# Patient Record
Sex: Female | Born: 1935 | Race: White | Hispanic: No | Smoking: Never smoker
Health system: Southern US, Community
[De-identification: ages and names within clinical notes are randomized; demographics above are authoritative.]

## PROBLEM LIST (undated history)

## (undated) DIAGNOSIS — F0390 Unspecified dementia without behavioral disturbance: Secondary | ICD-10-CM

## (undated) HISTORY — PX: REPLACEMENT TOTAL HIP W/  RESURFACING IMPLANTS: SUR1222

---

## 2004-06-04 ENCOUNTER — Ambulatory Visit: Payer: Self-pay | Admitting: Internal Medicine

## 2005-05-14 ENCOUNTER — Ambulatory Visit: Payer: Self-pay

## 2005-06-22 ENCOUNTER — Ambulatory Visit: Payer: Self-pay | Admitting: Internal Medicine

## 2006-06-30 ENCOUNTER — Ambulatory Visit: Payer: Self-pay | Admitting: Internal Medicine

## 2007-01-24 ENCOUNTER — Ambulatory Visit: Payer: Self-pay | Admitting: Internal Medicine

## 2007-04-05 ENCOUNTER — Ambulatory Visit: Payer: Self-pay | Admitting: Internal Medicine

## 2007-07-03 ENCOUNTER — Ambulatory Visit: Payer: Self-pay | Admitting: Internal Medicine

## 2008-02-22 ENCOUNTER — Ambulatory Visit: Payer: Self-pay | Admitting: Internal Medicine

## 2008-07-04 ENCOUNTER — Ambulatory Visit: Payer: Self-pay | Admitting: Internal Medicine

## 2008-08-26 ENCOUNTER — Ambulatory Visit: Payer: Self-pay | Admitting: Internal Medicine

## 2009-07-07 ENCOUNTER — Ambulatory Visit: Payer: Self-pay | Admitting: Internal Medicine

## 2009-09-16 ENCOUNTER — Ambulatory Visit: Payer: Self-pay | Admitting: Internal Medicine

## 2009-12-11 ENCOUNTER — Encounter: Payer: Self-pay | Admitting: Rheumatology

## 2010-07-08 ENCOUNTER — Ambulatory Visit: Payer: Self-pay | Admitting: Internal Medicine

## 2011-07-23 ENCOUNTER — Ambulatory Visit: Payer: Self-pay | Admitting: Internal Medicine

## 2012-03-15 ENCOUNTER — Ambulatory Visit: Payer: Self-pay | Admitting: Internal Medicine

## 2012-07-25 ENCOUNTER — Ambulatory Visit: Payer: Self-pay | Admitting: Internal Medicine

## 2013-02-12 ENCOUNTER — Ambulatory Visit: Payer: Self-pay | Admitting: Internal Medicine

## 2013-08-24 ENCOUNTER — Ambulatory Visit: Payer: Self-pay | Admitting: Internal Medicine

## 2014-01-07 ENCOUNTER — Ambulatory Visit: Payer: Self-pay | Admitting: Internal Medicine

## 2014-06-27 ENCOUNTER — Ambulatory Visit: Payer: Self-pay | Admitting: Internal Medicine

## 2014-09-19 ENCOUNTER — Ambulatory Visit
Admit: 2014-09-19 | Disposition: A | Discharge status: Home / Self Care | Payer: Self-pay | Attending: Internal Medicine | Admitting: Internal Medicine

## 2016-03-09 ENCOUNTER — Other Ambulatory Visit: Payer: Self-pay | Admitting: Internal Medicine

## 2016-03-09 DIAGNOSIS — M79604 Pain in right leg: Secondary | ICD-10-CM

## 2016-03-09 DIAGNOSIS — R600 Localized edema: Secondary | ICD-10-CM

## 2016-03-25 ENCOUNTER — Other Ambulatory Visit: Payer: Self-pay | Admitting: Internal Medicine

## 2016-03-25 DIAGNOSIS — M79604 Pain in right leg: Secondary | ICD-10-CM

## 2016-03-25 DIAGNOSIS — R600 Localized edema: Secondary | ICD-10-CM

## 2016-04-01 ENCOUNTER — Ambulatory Visit
Admission: RE | Admit: 2016-04-01 | Discharge: 2016-04-01 | Disposition: A | Discharge status: Home / Self Care | Payer: Medicare Other | Source: Ambulatory Visit | Attending: Internal Medicine | Admitting: Internal Medicine

## 2016-04-01 DIAGNOSIS — M79604 Pain in right leg: Secondary | ICD-10-CM | POA: Diagnosis present

## 2016-04-01 DIAGNOSIS — R600 Localized edema: Secondary | ICD-10-CM | POA: Diagnosis present

## 2018-01-17 ENCOUNTER — Emergency Department: Payer: Medicare Other

## 2018-01-17 ENCOUNTER — Emergency Department
Admission: EM | Admit: 2018-01-17 | Discharge: 2018-01-17 | Disposition: A | Discharge status: Home / Self Care | Payer: Medicare Other | Attending: Emergency Medicine | Admitting: Emergency Medicine

## 2018-01-17 DIAGNOSIS — F0390 Unspecified dementia without behavioral disturbance: Secondary | ICD-10-CM | POA: Diagnosis not present

## 2018-01-17 DIAGNOSIS — Z96649 Presence of unspecified artificial hip joint: Secondary | ICD-10-CM | POA: Diagnosis not present

## 2018-01-17 DIAGNOSIS — Y998 Other external cause status: Secondary | ICD-10-CM | POA: Insufficient documentation

## 2018-01-17 DIAGNOSIS — S62101A Fracture of unspecified carpal bone, right wrist, initial encounter for closed fracture: Secondary | ICD-10-CM | POA: Diagnosis not present

## 2018-01-17 DIAGNOSIS — Y939 Activity, unspecified: Secondary | ICD-10-CM | POA: Diagnosis not present

## 2018-01-17 DIAGNOSIS — W19XXXA Unspecified fall, initial encounter: Secondary | ICD-10-CM | POA: Insufficient documentation

## 2018-01-17 DIAGNOSIS — Y929 Unspecified place or not applicable: Secondary | ICD-10-CM | POA: Diagnosis not present

## 2018-01-17 DIAGNOSIS — S6991XA Unspecified injury of right wrist, hand and finger(s), initial encounter: Secondary | ICD-10-CM | POA: Diagnosis present

## 2018-01-17 HISTORY — DX: Unspecified dementia without behavioral disturbance: F03.90

## 2018-01-17 LAB — BASIC METABOLIC PANEL
Anion gap: 4 — ABNORMAL LOW (ref 5–15)
BUN: 22 mg/dL (ref 8–23)
CALCIUM: 9.7 mg/dL (ref 8.9–10.3)
CHLORIDE: 113 mmol/L — AB (ref 98–111)
CO2: 27 mmol/L (ref 22–32)
CREATININE: 0.87 mg/dL (ref 0.44–1.00)
GFR calc non Af Amer: 59 mL/min — ABNORMAL LOW (ref >60)
Glucose, Bld: 206 mg/dL — ABNORMAL HIGH (ref 70–99)
Potassium: 5.1 mmol/L (ref 3.5–5.1)
SODIUM: 144 mmol/L (ref 135–145)

## 2018-01-17 LAB — URINALYSIS, COMPLETE (UACMP) WITH MICROSCOPIC
Bacteria, UA: NONE SEEN
Bilirubin Urine: NEGATIVE
Glucose, UA: >=500 mg/dL — AB
Hgb urine dipstick: NEGATIVE
KETONES UR: NEGATIVE mg/dL
Nitrite: NEGATIVE
PH: 6 (ref 5.0–8.0)
PROTEIN: NEGATIVE mg/dL
Specific Gravity, Urine: 1.022 (ref 1.005–1.030)

## 2018-01-17 LAB — CBC WITH DIFFERENTIAL/PLATELET
Basophils Absolute: 0 10*3/uL (ref 0–0.1)
Basophils Relative: 0 %
EOS ABS: 0 10*3/uL (ref 0–0.7)
EOS PCT: 0 %
HCT: 40 % (ref 35.0–47.0)
Hemoglobin: 13.4 g/dL (ref 12.0–16.0)
Lymphocytes Relative: 11 %
Lymphs Abs: 1.3 10*3/uL (ref 1.0–3.6)
MCH: 29.9 pg (ref 26.0–34.0)
MCHC: 33.4 g/dL (ref 32.0–36.0)
MCV: 89.5 fL (ref 80.0–100.0)
MONO ABS: 1.1 10*3/uL — AB (ref 0.2–0.9)
MONOS PCT: 10 %
Neutro Abs: 9.1 10*3/uL — ABNORMAL HIGH (ref 1.4–6.5)
Neutrophils Relative %: 79 %
PLATELETS: 193 10*3/uL (ref 150–440)
RBC: 4.47 MIL/uL (ref 3.80–5.20)
RDW: 13.9 % (ref 11.5–14.5)
WBC: 11.5 10*3/uL — ABNORMAL HIGH (ref 3.6–11.0)

## 2018-01-17 LAB — TROPONIN I

## 2018-01-17 MED ORDER — LORAZEPAM 2 MG/ML IJ SOLN
1.0000 mg | Freq: Once | INTRAMUSCULAR | Status: AC
  Administered 2018-01-17: 1 mg via INTRAMUSCULAR
  Filled 2018-01-17: qty 1

## 2018-01-17 NOTE — ED Provider Notes (Signed)
[facility] Emergency Department Provider Note  ____________________________________________   I have reviewed the triage vital signs and the nursing notes.   HISTORY  Chief Complaint Fall   History limited by and level 5 caveat due to: Dementia   HPI Dorothy Murphy is a 84 y.o. female who presents to the emergency department today via EMS because of concerns for right wrist pain after a fall.  Patient has advanced dementia and cannot give any history.  Per EMS report the patient had a fall at 3 AM today.  Family had been trying to convince her to come to the emergency department throughout the day.  Finally called EMS.  Upon arrival they noted some slight deformity to the right wrist.    Per medical record review patient has a history of dementia  Past Medical History:  Diagnosis Date  . Advanced dementia     There are no active problems to display for this patient.   Past Surgical History:  Procedure Laterality Date  . REPLACEMENT TOTAL HIP W/  RESURFACING IMPLANTS     family unaable to recall which hip     Prior to Admission medications   Not on File    Allergies Patient has no allergy information on record.  History reviewed. No pertinent family history.  Social History Social History   Tobacco Use  . Smoking status: Not on file  Substance Use Topics  . Alcohol use: Never    Frequency: Never  . Drug use: Never    Review of Systems Unable to obtain secondary to dementia.   ____________________________________________   PHYSICAL EXAM:  VITAL SIGNS: ED Triage Vitals  Enc Vitals Group     BP 01/17/18 1716 (!) 133/58     Pulse Rate 01/17/18 1715 81     Resp 01/17/18 1715 18     Temp --      Temp src --      SpO2 01/17/18 1715 99 %     Weight 01/17/18 1716 150 lb (68 kg)     Height 01/17/18 1716 5\' 4"  (1.626 m)   Constitutional: Awake and alert. Not oriented. Eyes: Conjunctivae are normal.  ENT      Head: Normocephalic  and atraumatic.      Nose: No congestion/rhinnorhea.      Mouth/Throat: Mucous membranes are moist.      Neck: No stridor. Hematological/Lymphatic/Immunilogical: No cervical lymphadenopathy. Cardiovascular: Normal rate, regular rhythm.  No murmurs, rubs, or gallops.  Respiratory: Normal respiratory effort without tachypnea nor retractions. Breath sounds are clear and equal bilaterally. No wheezes/rales/rhonchi. Gastrointestinal: Soft and non tender. No rebound. No guarding.  Genitourinary: Deferred Musculoskeletal: Slight swelling and deformity noted to right wrist. Tender to palpation.  Neurologic:  Not oriented to place, time or events. Moving all extremities. Skin:  Skin is warm, dry and intact. No rash noted. Psychiatric: Demented  ____________________________________________    LABS (pertinent positives/negatives)  Trop <0.03 CBC wbc 11.5, hgb 13.4, plt 193 BMP na 144, k 5.1, glu 206, cr 0.87 UA >500 glucose, moderate leukocytes, 11-20 wbc 6-10 squamous  ____________________________________________   EKG  I, Phineas SemenGraydon Srihaan Mastrangelo, attending physician, personally viewed and interpreted this EKG  EKG Time: 1853 Rate: 76 Rhythm: normal sinus rhythm Axis: left axis deviation Intervals: qtc 422 QRS: narrow ST changes: no st elevation Impression: abnormal ekg  ____________________________________________    RADIOLOGY  Right wrist Distal radial fracture  I, Phineas SemenGraydon Aastha Dayley, personally viewed and evaluated these images (plain radiographs) as part  of my medical decision making.  CT head/cervical spine No acute process ____________________________________________   PROCEDURES  Procedures  POST SPLINT CHECK Right volar splint applied by tech.  Good position.  Distally N/V intact, sensation intact. No discoloration.  ____________________________________________   INITIAL IMPRESSION / ASSESSMENT AND PLAN / ED COURSE  Pertinent labs & imaging results that were  available during my care of the patient were reviewed by me and considered in my medical decision making (see chart for details).   Presented to the emergency department today after a fall that occurred last night.  Patient with some swelling and deformity to the right wrist.  X-rays do show a distal right radial fracture.  This will be splinted.  Discussed this with family importance of orthopedic follow-up.  In addition blood work and urine was checked.  Urine did have some white blood cells but also had squamous cells.  At this point will send for urine culture.  Discussed this finding with the patient's son.  ____________________________________________   FINAL CLINICAL IMPRESSION(S) / ED DIAGNOSES  Final diagnoses:  Fall, initial encounter  Closed fracture of right wrist, initial encounter     Note: This dictation was prepared with Nurse, children'sDragon dictation. Any transcriptional errors that result from this process are unintentional      Phineas SemenGoodman, Newell Wafer, MD 01/17/18 1934

## 2018-01-17 NOTE — ED Triage Notes (Signed)
Pt arrives via ems from home with reports of unwitnessed fall at 3am this morning. Pt family states where pt fell pt would not have hit head on anything. Pt had advanced dementia, alert, but not oriented. Ems states right wrist swelling and  deformity from fall. No acute distress at this time

## 2018-01-17 NOTE — Discharge Instructions (Signed)
Please seek medical attention for any high fevers, chest pain, shortness of breath, change in behavior, persistent vomiting, bloody stool or any other new or concerning symptoms.  

## 2018-01-17 NOTE — ED Notes (Signed)
On arrival wrist splinted by ems. Md removed to assess pt. Pt scratching at wrist and moving arm around freely but moans when she moves wrist. RN attempted redirect pt and have her decrease movement, also attempted to re splint wrist but pt removing wrap at this time and will not leave in place.

## 2018-01-19 LAB — URINE CULTURE: Culture: NO GROWTH

## 2018-01-22 ENCOUNTER — Other Ambulatory Visit: Payer: Self-pay

## 2018-01-22 ENCOUNTER — Encounter: Payer: Self-pay | Admitting: Emergency Medicine

## 2018-01-22 ENCOUNTER — Inpatient Hospital Stay
Admission: EM | Admit: 2018-01-22 | Discharge: 2018-01-24 | DRG: 536 | Disposition: A | Discharge status: Skilled Nursing Facility | Payer: Medicare Other | Attending: Internal Medicine | Admitting: Internal Medicine

## 2018-01-22 ENCOUNTER — Emergency Department: Payer: Medicare Other

## 2018-01-22 DIAGNOSIS — S32599A Other specified fracture of unspecified pubis, initial encounter for closed fracture: Secondary | ICD-10-CM | POA: Diagnosis present

## 2018-01-22 DIAGNOSIS — F0390 Unspecified dementia without behavioral disturbance: Secondary | ICD-10-CM

## 2018-01-22 DIAGNOSIS — Z79899 Other long term (current) drug therapy: Secondary | ICD-10-CM

## 2018-01-22 DIAGNOSIS — R64 Cachexia: Secondary | ICD-10-CM | POA: Diagnosis not present

## 2018-01-22 DIAGNOSIS — R7303 Prediabetes: Secondary | ICD-10-CM | POA: Diagnosis not present

## 2018-01-22 DIAGNOSIS — S5011XD Contusion of right forearm, subsequent encounter: Secondary | ICD-10-CM | POA: Diagnosis not present

## 2018-01-22 DIAGNOSIS — S32591A Other specified fracture of right pubis, initial encounter for closed fracture: Principal | ICD-10-CM | POA: Diagnosis present

## 2018-01-22 DIAGNOSIS — W19XXXA Unspecified fall, initial encounter: Secondary | ICD-10-CM | POA: Diagnosis present

## 2018-01-22 DIAGNOSIS — Z96649 Presence of unspecified artificial hip joint: Secondary | ICD-10-CM | POA: Diagnosis present

## 2018-01-22 DIAGNOSIS — E860 Dehydration: Secondary | ICD-10-CM | POA: Diagnosis not present

## 2018-01-22 DIAGNOSIS — R627 Adult failure to thrive: Secondary | ICD-10-CM | POA: Diagnosis not present

## 2018-01-22 DIAGNOSIS — Z6825 Body mass index (BMI) 25.0-25.9, adult: Secondary | ICD-10-CM | POA: Diagnosis not present

## 2018-01-22 DIAGNOSIS — S52501D Unspecified fracture of the lower end of right radius, subsequent encounter for closed fracture with routine healing: Secondary | ICD-10-CM

## 2018-01-22 DIAGNOSIS — W19XXXD Unspecified fall, subsequent encounter: Secondary | ICD-10-CM | POA: Diagnosis not present

## 2018-01-22 DIAGNOSIS — E870 Hyperosmolality and hypernatremia: Secondary | ICD-10-CM | POA: Diagnosis present

## 2018-01-22 DIAGNOSIS — Z66 Do not resuscitate: Secondary | ICD-10-CM | POA: Diagnosis not present

## 2018-01-22 DIAGNOSIS — M25551 Pain in right hip: Secondary | ICD-10-CM | POA: Diagnosis present

## 2018-01-22 DIAGNOSIS — N3000 Acute cystitis without hematuria: Secondary | ICD-10-CM

## 2018-01-22 DIAGNOSIS — Z7189 Other specified counseling: Secondary | ICD-10-CM

## 2018-01-22 DIAGNOSIS — Z515 Encounter for palliative care: Secondary | ICD-10-CM

## 2018-01-22 LAB — COMPREHENSIVE METABOLIC PANEL
ALT: 23 U/L (ref 0–44)
AST: 31 U/L (ref 15–41)
Albumin: 3.1 g/dL — ABNORMAL LOW (ref 3.5–5.0)
Alkaline Phosphatase: 59 U/L (ref 38–126)
Anion gap: 8 (ref 5–15)
BILIRUBIN TOTAL: 1.5 mg/dL — AB (ref 0.3–1.2)
BUN: 36 mg/dL — ABNORMAL HIGH (ref 8–23)
CO2: 24 mmol/L (ref 22–32)
CREATININE: 0.85 mg/dL (ref 0.44–1.00)
Calcium: 8.8 mg/dL — ABNORMAL LOW (ref 8.9–10.3)
Chloride: 115 mmol/L — ABNORMAL HIGH (ref 98–111)
Glucose, Bld: 126 mg/dL — ABNORMAL HIGH (ref 70–99)
Potassium: 3.4 mmol/L — ABNORMAL LOW (ref 3.5–5.1)
Sodium: 147 mmol/L — ABNORMAL HIGH (ref 135–145)
TOTAL PROTEIN: 5.9 g/dL — AB (ref 6.5–8.1)

## 2018-01-22 LAB — CBC
HEMATOCRIT: 37.7 % (ref 35.0–47.0)
Hemoglobin: 12.8 g/dL (ref 12.0–16.0)
MCH: 30.1 pg (ref 26.0–34.0)
MCHC: 34 g/dL (ref 32.0–36.0)
MCV: 88.7 fL (ref 80.0–100.0)
Platelets: 181 10*3/uL (ref 150–440)
RBC: 4.25 MIL/uL (ref 3.80–5.20)
RDW: 13.5 % (ref 11.5–14.5)
WBC: 10 10*3/uL (ref 3.6–11.0)

## 2018-01-22 LAB — URINALYSIS, COMPLETE (UACMP) WITH MICROSCOPIC
Bacteria, UA: NONE SEEN
Bilirubin Urine: NEGATIVE
GLUCOSE, UA: NEGATIVE mg/dL
HGB URINE DIPSTICK: NEGATIVE
Ketones, ur: 5 mg/dL — AB
Leukocytes, UA: NEGATIVE
NITRITE: NEGATIVE
PH: 5 (ref 5.0–8.0)
Protein, ur: NEGATIVE mg/dL
SPECIFIC GRAVITY, URINE: 1.029 (ref 1.005–1.030)

## 2018-01-22 MED ORDER — ONDANSETRON HCL 4 MG PO TABS
4.0000 mg | ORAL_TABLET | Freq: Four times a day (QID) | ORAL | Status: DC | PRN
Start: 2018-01-22 — End: 2018-01-24

## 2018-01-22 MED ORDER — ENOXAPARIN SODIUM 40 MG/0.4ML ~~LOC~~ SOLN
40.0000 mg | SUBCUTANEOUS | Status: DC
  Administered 2018-01-22 – 2018-01-23 (×2): 40 mg via SUBCUTANEOUS
  Filled 2018-01-22 (×2): qty 0.4

## 2018-01-22 MED ORDER — SODIUM CHLORIDE 0.9 % IV BOLUS
500.0000 mL | Freq: Once | INTRAVENOUS | Status: AC
  Administered 2018-01-22: 500 mL via INTRAVENOUS

## 2018-01-22 MED ORDER — TRAMADOL HCL 50 MG PO TABS
50.0000 mg | ORAL_TABLET | Freq: Four times a day (QID) | ORAL | Status: DC | PRN
  Administered 2018-01-23 (×2): 50 mg via ORAL
  Filled 2018-01-22 (×2): qty 1

## 2018-01-22 MED ORDER — ACETAMINOPHEN 325 MG PO TABS
650.0000 mg | ORAL_TABLET | Freq: Four times a day (QID) | ORAL | Status: DC | PRN
  Administered 2018-01-24: 650 mg via ORAL
  Filled 2018-01-22: qty 2

## 2018-01-22 MED ORDER — SODIUM CHLORIDE 0.9 % IV SOLN
1.0000 g | INTRAVENOUS | Status: DC
  Administered 2018-01-22: 1 g via INTRAVENOUS
  Filled 2018-01-22: qty 1
  Filled 2018-01-22: qty 10

## 2018-01-22 MED ORDER — POTASSIUM CHLORIDE CRYS ER 10 MEQ PO TBCR
10.0000 meq | EXTENDED_RELEASE_TABLET | Freq: Every day | ORAL | Status: DC
  Administered 2018-01-22: 10 meq via ORAL
  Filled 2018-01-22: qty 1

## 2018-01-22 MED ORDER — ONDANSETRON HCL 4 MG/2ML IJ SOLN: 4.0000 mg | Freq: Four times a day (QID) | INTRAMUSCULAR | Status: DC | PRN

## 2018-01-22 MED ORDER — LORAZEPAM 0.5 MG PO TABS
0.5000 mg | ORAL_TABLET | Freq: Two times a day (BID) | ORAL | Status: DC | PRN
Start: 2018-01-22 — End: 2018-01-24

## 2018-01-22 MED ORDER — DOCUSATE SODIUM 100 MG PO CAPS: 100.0000 mg | ORAL_CAPSULE | Freq: Two times a day (BID) | ORAL | Status: DC

## 2018-01-22 MED ORDER — ENOXAPARIN SODIUM 30 MG/0.3ML ~~LOC~~ SOLN: 30.0000 mg | SUBCUTANEOUS | Status: DC

## 2018-01-22 MED ORDER — ACETAMINOPHEN 650 MG RE SUPP: 650.0000 mg | Freq: Four times a day (QID) | RECTAL | Status: DC | PRN

## 2018-01-22 NOTE — Progress Notes (Signed)
Family Meeting Note  Advance Directive:yes  Today a meeting took place with the Patient, patient's son at bedside  Patient is unable to participate due ZO:XWRUEAto:Lacked capacity Dementia   The following clinical team members were present during this meeting:MD  The following were discussed:Patient's diagnosis: Acute cystitis, failure to thrive, dehydration, pubic ramus fracture, fall, borderline diabetes mellitus, treatment plan of care discussed in detail with the patient son and his girlfriend at bedside   patient's progosis: Unable to determine and Goals for treatment: DNR, son Gerlene BurdockRichard is the medical healthcare power of attorney  Additional follow-up to be provided: Hospitalist, palliative care  Time spent during discussion:19 min  Dorothy LabAruna Avaiyah Strubel, MD

## 2018-01-22 NOTE — ED Notes (Addendum)
Patient transported to X-ray 

## 2018-01-22 NOTE — ED Notes (Signed)
Pt back in Room 1 from Xray, back on monitor

## 2018-01-22 NOTE — Plan of Care (Signed)

## 2018-01-22 NOTE — H&P (Signed)
[facility] [place] [facility]   PATIENT NAME: Dorothy Murphy Adler    MR#:  [id number]  DATE OF BIRTH:  01/03/1929  DATE OF ADMISSION:  01/22/2018  PRIMARY CARE PHYSICIAN: Marguarite ArbourSparks, Jeffrey D, MD   REQUESTING/REFERRING PHYSICIAN:Schaevitz, Myra Rudeavid Matthew, MD  CHIEF COMPLAINT:  Fall, dehydration failure to thrive  HISTORY OF PRESENT ILLNESS:  Dorothy Murphy Finlay  is a 84 y.o. female with a known history of dementia with recent history of right-sided wrist fracture is brought into the ED after she sustained a fall on Tuesday.  Since the fall patient was not walking, not eating or drinking which is concerning to the family members and patient is brought into the emergency department x-ray of the has revealed pubic ramus fracture on the right side.  Urine looks abnormal.  Hospitalist team is called to admit the patient  PAST MEDICAL HISTORY:   Past Medical History:  Diagnosis Date  . Advanced dementia     PAST SURGICAL HISTOIRY:   Past Surgical History:  Procedure Laterality Date  . REPLACEMENT TOTAL HIP W/  RESURFACING IMPLANTS     family unaable to recall which hip     SOCIAL HISTORY:   Social History   Tobacco Use  . Smoking status: Not on file  Substance Use Topics  . Alcohol use: Never    Frequency: Never    FAMILY HISTORY:  No family history on file.  DRUG ALLERGIES:  Not on File  REVIEW OF SYSTEMS:  Review of system unobtainable as the patient is demented chronically  MEDICATIONS AT HOME:   Prior to Admission medications   Medication Sig Start Date End Date Taking? Authorizing Provider  LORazepam (ATIVAN) 0.5 MG tablet Take 0.5 mg by mouth every 12 (twelve) hours as needed. for anxiety 11/15/17  Yes [provider]  potassium chloride (MICRO-K) 10 MEQ CR capsule Take 10 mEq by mouth 2 (two) times daily. 03/21/17 03/21/18 Yes [provider]      VITAL SIGNS:  Blood pressure 134/65, pulse 78, temperature 98.3 F (36.8 C),  temperature source Axillary, resp. rate 20, height 5\' 4"  (1.626 m), weight 68 kg, SpO2 100 %.  PHYSICAL EXAMINATION:  GENERAL:  84 y.o.-year-old patient lying in the bed with no acute distress.   emaciated EYES: Pupils equal, round, reactive to light and accommodation. No scleral icterus. Extraocular muscles intact.  HEENT: Head atraumatic, normocephalic. Oropharynx and nasopharynx clear.  NECK:  Supple, no jugular venous distention. No thyroid enlargement, no tenderness.  LUNGS: Normal breath sounds bilaterally, no wheezing, rales,rhonchi or crepitation. No use of accessory muscles of respiration.  CARDIOVASCULAR: S1, S2 normal. No murmurs, rubs, or gallops.  ABDOMEN: Soft, nontender, nondistended. Bowel sounds present. EXTREMITIES: Right pubic ramus is tender no pedal edema, cyanosis, or clubbing.  NEUROLOGIC: Awake, alert and disoriented  pSYCHIATRIC: The patient is alert and disoriented SKIN: No obvious rash, lesion, or ulcer.   LABORATORY PANEL:   CBC Recent Labs  Lab 01/22/18 1435  WBC 10.0  HGB 12.8  HCT 37.7  PLT 181   ------------------------------------------------------------------------------------------------------------------  Chemistries  Recent Labs  Lab 01/22/18 1435  NA 147*  K 3.4*  CL 115*  CO2 24  GLUCOSE 126*  BUN 36*  CREATININE 0.85  CALCIUM 8.8*  AST 31  ALT 23  ALKPHOS 59  BILITOT 1.5*   ------------------------------------------------------------------------------------------------------------------  Cardiac Enzymes Recent Labs  Lab 01/17/18 1814  TROPONINI <0.03   ------------------------------------------------------------------------------------------------------------------  RADIOLOGY:  Dg Hip Unilat W Or Wo Pelvis 2-3  Views Right  Result Date: 01/22/2018 CLINICAL DATA:  Persists right hip pain.  After fall 1 week ago EXAM: DG HIP (WITH OR WITHOUT PELVIS) 2-3V RIGHT COMPARISON:  CT abdomen pelvis dated March 15, 2012. FINDINGS:  Acute minimally displaced fractures of the right superior and inferior pubic rami. No femur fracture. The pubic symphysis and sacroiliac joints are intact. Prior left total hip arthroplasty. Severe osteopenia. Soft tissues are unremarkable. IMPRESSION: Acute minimally displaced fractures of the right superior and inferior pubic rami. Electronically Signed   By: Obie DredgeWilliam T Derry M.D.   On: 01/22/2018 15:51    EKG:   Orders placed or performed during the hospital encounter of 01/22/18  . ED EKG  . ED EKG  . EKG 12-Lead  . EKG 12-Lead    IMPRESSION AND PLAN:   Dorothy Murphy  is a 84 y.o. female with a known history of dementia with recent history of right-sided wrist fracture is brought into the ED after she sustained a fall on Tuesday.  Since the fall patient was not walking, not eating or drinking which is concerning to the family members and patient is brought into the emergency department x-ray of the has revealed pubic ramus fracture on the right side.  #Acute cystitis Admit to MedSurg floor We will get urine culture and sensitivity and patient is started on empiric IV antibiotic Rocephin Gentle hydration with IV fluids  #Failure to thrive Patient stopped eating or drinking from last Tuesday Has baseline dementia and progressively getting worse Hydrate with IV fluids Check TSH Palliative care consult placed  #Fall with right pubic ramus fracture Nonoperable Pain management as needed PT consult  #Dehydration from poor p.o. Intake Hydrate with IV fluids and monitor patient closely  # borderline diabetes mellitus Check hemoglobin A1c and sliding scale insulin   All the records are reviewed and case discussed with ED provider. Management plans discussed with the patient, family and they are in agreement.  CODE STATUS: DNR, son Gerlene BurdockRichard is a healthcare power of attorney  TOTAL TIME TAKING CARE OF THIS PATIENT: 43minutes.   Note: This dictation was prepared with Dragon dictation  along with smaller phrase technology. Any transcriptional errors that result from this process are unintentional.  Ramonita LabAruna Tonetta Napoles M.D on 01/22/2018 at 5:14 PM  Between 7am to 6pm - Pager - [phone]  After 6pm go to [email or web address] - password [email or web address] [facility]  [facility]  Office  [phone]  CC: Primary care physician; Marguarite ArbourSparks, Jeffrey D, MD

## 2018-01-22 NOTE — ED Provider Notes (Signed)
[facility] Emergency Department Provider Note  ____________________________________________   First MD Initiated Contact with Patient 01/22/18 1503     (approximate)  I have reviewed the triage vital signs and the nursing notes.   HISTORY  Chief Complaint Failure To Thrive   HPI Dorothy Murphy is a 84 y.o. female with a history of dementia and recent right-sided wrist fracture who was presented to the emergency department failure to thrive as well as right-sided hip pain.  The patient had a fall this past Tuesday and was diagnosed with a right wrist fracture.  She was then dispositioned home and is now nonambulatory.  The family reports they are transferring her in a sheet to the bathroom and also to bathe her.  Patient also with decreased p.o. intake.  However at baseline mental status was mumbling and occasionally saying several words.  Family also reports the patient's urine is concentrated and with a strong smell.  Wearing right upper extremity splint.  Family said they are looking into placement as well as hospice care for this patient at this time.   Past Medical History:  Diagnosis Date  . Advanced dementia     There are no active problems to display for this patient.   Past Surgical History:  Procedure Laterality Date  . REPLACEMENT TOTAL HIP W/  RESURFACING IMPLANTS     family unaable to recall which hip     Prior to Admission medications   Not on File    Allergies Patient has no allergy information on record.  No family history on file.  Social History Social History   Tobacco Use  . Smoking status: Not on file  Substance Use Topics  . Alcohol use: Never    Frequency: Never  . Drug use: Never    Review of Systems  Level 5 caveat secondary to dementia.   ____________________________________________   PHYSICAL EXAM:  VITAL SIGNS: ED Triage Vitals  Enc Vitals Group     BP 01/22/18 1424 113/83     Pulse Rate 01/22/18  1424 84     Resp 01/22/18 1424 20     Temp 01/22/18 1424 98.3 F (36.8 C)     Temp Source 01/22/18 1424 Axillary     SpO2 01/22/18 1422 100 %     Weight 01/22/18 1422 150 lb (68 kg)     Height 01/22/18 1422 5\' 4"  (1.626 m)     Head Circumference --      Peak Flow --      Pain Score --      Pain Loc --      Pain Edu? --      Excl. in GC? --     Constitutional: Awake and alert but mumbling and unable to give details of history. Eyes: Conjunctivae are normal.  Head: Atraumatic. Nose: No congestion/rhinnorhea. Mouth/Throat: Mucous membranes are moist.  Neck: No stridor.   Cardiovascular: Normal rate, regular rhythm. Grossly normal heart sounds.   Respiratory: Normal respiratory effort.  No retractions. Lungs CTAB. Gastrointestinal: Soft with tenderness lower abdomen/pelvic region.  No distention. Musculoskeletal: No lower extremity tenderness nor edema.  No joint effusions. Patient with tenderness in the lower abdomen likely consistent with pain over the fracture sites.  As the most tenderness is over the right bony pelvis.  No limb shortening or external rotation.  Neurologic:  No gross focal neurologic deficits are appreciated. Skin:  Skin is warm, dry and intact. No rash noted.   ____________________________________________  LABS (all labs ordered are listed, but only abnormal results are displayed)  Labs Reviewed  COMPREHENSIVE METABOLIC PANEL - Abnormal; Notable for the following components:      Result Value   Sodium 147 (*)    Potassium 3.4 (*)    Chloride 115 (*)    Glucose, Bld 126 (*)    BUN 36 (*)    Calcium 8.8 (*)    Total Protein 5.9 (*)    Albumin 3.1 (*)    Total Bilirubin 1.5 (*)    All other components within normal limits  URINE CULTURE  CBC  URINALYSIS, COMPLETE (UACMP) WITH MICROSCOPIC   ____________________________________________  EKG  ED ECG REPORT I, Arelia Longestavid M Baila Rouse, the attending physician, personally viewed and interpreted this  ECG.   Date: 01/22/2018  EKG Time: 1853  Rate: 76  Rhythm: normal sinus rhythm with PVC  Axis: Normal  Intervals:none  ST&T Change: No ST segment elevation or depression.  No abnormal T wave inversion.  ____________________________________________  RADIOLOGY  Inferior as well as superior pubic rami fractures. ____________________________________________   PROCEDURES  Procedure(s) performed:   Procedures  Critical Care performed:   ____________________________________________   INITIAL IMPRESSION / ASSESSMENT AND PLAN / ED COURSE  Pertinent labs & imaging results that were available during my care of the patient were reviewed by me and considered in my medical decision making (see chart for details).  DDX: Failure to thrive, UTI, renal failure, pelvic fracture hip fracture As part of my medical decision making, I reviewed the following data within the electronic MEDICAL RECORD NUMBER Notes from prior ED visits  ----------------------------------------- 4:35 PM on 01/22/2018 -----------------------------------------  Patient will be admitted to the hospital given IV fluids.  Appears dehydrated with an elevated BUN as well as sodium.  Normal creatinine.  Recent urine culture was negative.  Likely concentrated urine causing the urinary symptoms at this time.  Family is requesting hospice.  Social work was consulted.  Because orthopedics at this time and awaiting callback from Dr. Martha ClanKrasinski.  Signed out to Dr. Amado CoeGouru of the hospitalist service will place a formal consult for orthopedics.  Likely not surgical candidate.  Family aware of diagnosis as well as treatment plan willing to comply. ____________________________________________   FINAL CLINICAL IMPRESSION(S) / ED DIAGNOSES  Dehydration.  Pelvic fractures.  Failure to thrive.  NEW MEDICATIONS STARTED DURING THIS VISIT:  New Prescriptions   No medications on file     Note:  This document was prepared using Dragon  voice recognition software and may include unintentional dictation errors.     Myrna BlazerSchaevitz, Sasan Wilkie Matthew, MD 01/22/18 [phone]

## 2018-01-22 NOTE — [place] FL2 (Addendum)
  [place] MEDICAID FL2 LEVEL OF CARE SCREENING TOOL     IDENTIFICATION  Patient Name: Dorothy Murphy Birthdate: 08/09/1928 Sex: female Admission Date (Current Location): 01/22/2018  [place]   Facility and Address:  [facility], [address], [place], [place] [address]      Provider Number: [id number]  Attending Physician Name and Address:  Ramonita LabGouru, Aruna, MD  Relative Name and Phone Number:   Liz BeachRichard Sword [phone]    Current Level of Care: Hospital Recommended Level of Care: Skilled Nursing Facility  Prior Approval Number:    Date Approved/Denied:   PASRR Number:   [id number]  Discharge Plan: Skilled Nursing Facility     Current Diagnoses: Patient Active Problem List   Diagnosis Date Noted  . Pubic ramus fracture (HCC) 01/22/2018    Orientation RESPIRATION BLADDER Height & Weight     Self  Normal Incontinent Weight: 150 lb (68 kg) Height:  5\' 4"  (162.6 cm)  BEHAVIORAL SYMPTOMS/MOOD NEUROLOGICAL BOWEL NUTRITION STATUS      Incontinent  Regular Diet   AMBULATORY STATUS COMMUNICATION OF NEEDS Skin   Extensive Assist Verbally Normal                       Personal Care Assistance Level of Assistance  Bathing, Feeding, Dressing, Total care Bathing Assistance: Maximum assistance Feeding assistance: Limited assistance Dressing Assistance: Limited assistance Total Care Assistance: Limited assistance   Functional Limitations Info  Sight, Hearing, Speech Sight Info: Adequate Hearing Info: Adequate Speech Info: Adequate    SPECIAL CARE FACTORS FREQUENCY  PT (By licensed PT), OT (By licensed OT)     PT Frequency: x5  OT Frequency: x5            Contractures Contractures Info: Not present    Additional Factors Info   DNR                Current Medications (01/22/2018):  This is the current hospital active medication list Current Facility-Administered Medications  Medication Dose Route  Frequency Provider Last Rate Last Dose  . cefTRIAXone (ROCEPHIN) 1 g in sodium chloride 0.9 % 100 mL IVPB  1 g Intravenous Q24H Gouru, Deanna ArtisAruna, MD         Discharge Medications: Please see discharge summary for a list of discharge medications.  Relevant Imaging Results:  Relevant Lab Results:   Additional Information  SSN: [id number]  Cheron SchaumannBandi, Claudine M, [place]

## 2018-01-22 NOTE — Progress Notes (Signed)
Patient admitted to room from ED. Family updated on plan of care. Patient ate a few bites of super but wanted to try again later. Call bell in reach. Low bed in place. Continue to monitor.

## 2018-01-22 NOTE — Clinical Social Work Note (Signed)
Clinical Social Work Assessment  Patient Details  Name: Dorothy Murphy MRN: [id number] Date of Birth: 07/14/1928  Date of referral:  01/22/18               Reason for consult:  Facility Placement                Permission sought to share information with:  Family Supports Permission granted to share information::  Yes, Verbal Permission Granted  Name::     Dorothy BeachRichard Murphy son- [phone]  Agency::  All facilities  Relationship::     Contact Information:     Housing/Transportation Living arrangements for the past 2 months:  Single Family Home Source of Information:  Adult Children Patient Interpreter Needed:  None Criminal Activity/Legal Involvement Pertinent to Current Situation/Hospitalization:  No - Comment as needed Significant Relationships:  Adult Children Lives with:  Adult Children Do you feel safe going back to the place where you live?  No Need for family participation in patient care:  Yes (Comment)  Care giving concerns: Son would like to have information for memory care and STR facilities ( this LCSW provided lists for facilities)   Social Worker assessment / plan: LCSW introduced myself to patient and family members. Patient with hip issues is not able to get out of bed and needs full assist with all her ADLs. As per son and daughter in law this patient hasnt eaten or drank a lot and is very weak at this time. Family would like to look into SNF-ALF- Memory care options.Awaiting continued medical workup and PT consult required. LCSW to complete Fl2 and assessment. LCSW provided family with several resources and options and they will need assistance closer to DC.  Employment status:  Teacher, adult educationHome-Maker Insurance information:  Engineer, petroleumMedicare([employer] care medicare) PT Recommendations:  Not assessed at this time Information / Referral to community resources:   Memory care and SNF  Patient/Family's Response to care:  Good understanding but concerned about higher level of  care  Patient/Family's Understanding of and Emotional Response to Diagnosis, Current Treatment, and Prognosis: Family has good understanding  Emotional Assessment Appearance:  Appears stated age Attitude/Demeanor/Rapport:  Charismatic, Gracious Affect (typically observed):  Pleasant, Unable to Assess Orientation:  Oriented to Self Alcohol / Substance use:  Not Applicable Psych involvement (Current and /or in the community):  No (Comment)  Discharge Needs  Concerns to be addressed:  Care Coordination Readmission within the last 30 days:  No Current discharge risk:  None Barriers to Discharge:  Continued Medical Work up   Dorothy SchaumannBandi, Dorothy Murphy M, LCSW 01/22/2018, 4:48 PM

## 2018-01-22 NOTE — Progress Notes (Signed)
Anticoagulation monitoring(Lovenox):  84yo  female ordered Lovenox 30 mg Q24h for DVT prevention  Filed Weights   01/22/18 1422  Weight: 150 lb (68 kg)   BMI     Lab Results  Component Value Date   CREATININE 0.85 01/22/2018   CREATININE 0.87 01/17/2018   Estimated Creatinine Clearance: 45 mL/min (by C-G formula based on SCr of 0.85 mg/dL). Hemoglobin & Hematocrit     Component Value Date/Time   HGB 12.8 01/22/2018 1435   HCT 37.7 01/22/2018 1435     Per Protocol for Patient with estCrcl > 30 ml/min and BMI < 40, will transition to Lovenox 40 mg Q24h.     Clovia CuffLisa Jordie Skalsky, PharmD, BCPS 01/22/2018 5:42 PM

## 2018-01-22 NOTE — ED Triage Notes (Signed)
Pt arrived via ems from home from home with concerns over a decline in health. Pt has a history of dementia and family wants to consult hospice care. Pt was recently seen and evaluated for a fall. A right wrist fracture was diagnosed. Upon today's assessment pt's right hip very sensitive and rotated but no shortening. PT poor historian and family is not at bedside.

## 2018-01-22 NOTE — Progress Notes (Signed)
   01/22/18 2000  Clinical Encounter Type  Visited With Patient  Visit Type Initial   Received order requisition to create Advance Directives.  Chaplain met with patient, who mumbles incoherently, cannot be understood.  Review of chart indicates diagnosis of advanced dementia.  Unfortunately, patient is not a candidate for Advance Directives because she does not have capacity to make these decisions or sign the legal document.

## 2018-01-23 DIAGNOSIS — Z7189 Other specified counseling: Secondary | ICD-10-CM | POA: Diagnosis not present

## 2018-01-23 DIAGNOSIS — Z515 Encounter for palliative care: Secondary | ICD-10-CM | POA: Diagnosis not present

## 2018-01-23 DIAGNOSIS — F0390 Unspecified dementia without behavioral disturbance: Secondary | ICD-10-CM

## 2018-01-23 DIAGNOSIS — N3000 Acute cystitis without hematuria: Secondary | ICD-10-CM

## 2018-01-23 DIAGNOSIS — M25551 Pain in right hip: Secondary | ICD-10-CM | POA: Diagnosis not present

## 2018-01-23 DIAGNOSIS — R627 Adult failure to thrive: Secondary | ICD-10-CM

## 2018-01-23 DIAGNOSIS — E860 Dehydration: Secondary | ICD-10-CM

## 2018-01-23 DIAGNOSIS — S32591A Other specified fracture of right pubis, initial encounter for closed fracture: Principal | ICD-10-CM

## 2018-01-23 LAB — COMPREHENSIVE METABOLIC PANEL
ALK PHOS: 71 U/L (ref 38–126)
ALT: 20 U/L (ref 0–44)
AST: 23 U/L (ref 15–41)
Albumin: 3.1 g/dL — ABNORMAL LOW (ref 3.5–5.0)
Anion gap: 4 — ABNORMAL LOW (ref 5–15)
BUN: 33 mg/dL — ABNORMAL HIGH (ref 8–23)
CALCIUM: 9.1 mg/dL (ref 8.9–10.3)
CHLORIDE: 118 mmol/L — AB (ref 98–111)
CO2: 28 mmol/L (ref 22–32)
CREATININE: 0.79 mg/dL (ref 0.44–1.00)
GFR calc Af Amer: >60 mL/min (ref >60)
Glucose, Bld: 140 mg/dL — ABNORMAL HIGH (ref 70–99)
Potassium: 4.7 mmol/L (ref 3.5–5.1)
Sodium: 150 mmol/L — ABNORMAL HIGH (ref 135–145)
Total Bilirubin: 1.1 mg/dL (ref 0.3–1.2)
Total Protein: 5.8 g/dL — ABNORMAL LOW (ref 6.5–8.1)

## 2018-01-23 LAB — CBC
HCT: 39.8 % (ref 35.0–47.0)
Hemoglobin: 13.4 g/dL (ref 12.0–16.0)
MCH: 29.9 pg (ref 26.0–34.0)
MCHC: 33.7 g/dL (ref 32.0–36.0)
MCV: 88.6 fL (ref 80.0–100.0)
PLATELETS: 201 10*3/uL (ref 150–440)
RBC: 4.49 MIL/uL (ref 3.80–5.20)
RDW: 13.6 % (ref 11.5–14.5)
WBC: 9.4 10*3/uL (ref 3.6–11.0)

## 2018-01-23 LAB — URINE CULTURE
CULTURE: NO GROWTH
SPECIAL REQUESTS: NORMAL

## 2018-01-23 LAB — TSH: TSH: 1.948 u[IU]/mL (ref 0.350–4.500)

## 2018-01-23 MED ORDER — POTASSIUM CHLORIDE 20 MEQ PO PACK
10.0000 meq | PACK | Freq: Every day | ORAL | Status: DC
Start: 2018-01-23 — End: 2018-01-23
  Administered 2018-01-23: 10 meq via ORAL
  Filled 2018-01-23: qty 1

## 2018-01-23 MED ORDER — SODIUM CHLORIDE 0.9 % IV SOLN
INTRAVENOUS | Status: DC
  Administered 2018-01-23: 22:00:00 via INTRAVENOUS

## 2018-01-23 MED ORDER — DOCUSATE SODIUM 50 MG/5ML PO LIQD
100.0000 mg | Freq: Two times a day (BID) | ORAL | Status: DC
Start: 2018-01-23 — End: 2018-01-24
  Administered 2018-01-23 (×2): 100 mg via ORAL
  Filled 2018-01-23 (×4): qty 10

## 2018-01-23 NOTE — Progress Notes (Signed)
Family Meeting Note  Advance Directive:yes  Today a meeting took place with the Patient and son, daughter in law at bedside.  Patient is unable to participate due ZO:XWRUEAto:Lacked capacity Dementia   The following clinical team members were present during this meeting:MD  The following were discussed:Patient's diagnosis:   84 y.o. female with a known history of dementia with recent history of right-sided wrist fracture is brought into the ED after she sustained a fall on Tuesday.  Since the fall patient was not walking, not eating or drinking. In the emergency department x-ray of the has revealed pubic ramus fracture on the right side.  Past Medical History:  Diagnosis Date  . Advanced dementia      Patient's progosis: < 6 months and Goals for treatment: DNR  Additional follow-up to be provided: Palliative care eval - consider long term care with Hospice  Time spent during discussion:20 minutes  Delfino LovettVipul Elesa Garman, MD

## 2018-01-23 NOTE — Progress Notes (Signed)
Pt has been unable to void since I&O cath done this AM per night shift. Bladder scan performed. Only 199 mL in bladder. Will encourage pt to void and recheck later.

## 2018-01-23 NOTE — Consult Note (Signed)
ORTHOPAEDIC CONSULTATION  REQUESTING PHYSICIAN: Delfino LovettShah, Vipul, MD  Chief Complaint: Right wrist and pelvic pain post fall  HPI: Dorothy Murphy is a 84 y.o. female advanced dementia who was initially seen in the emergency department on 01/17/2017.  Patient was reported to have a fall on 01/17/2018.  She was noted to have deformity of the right wrist.  She was splinted and was due to follow-up with orthopedics.  She was readmitted overnight with failure to thrive at home.  She was not walking and was not eating or drinking.  Today at the bedside the family explains that she was walking prior to her recent fall.  X-ray films upon admission to the ER revealed nondisplaced fractures of the right superior and inferior rami.  Past Medical History:  Diagnosis Date  . Advanced dementia    Past Surgical History:  Procedure Laterality Date  . REPLACEMENT TOTAL HIP W/  RESURFACING IMPLANTS     family unaable to recall which hip    Social History   Socioeconomic History  . Marital status: Widowed    Spouse name: Not on file  . Number of children: Not on file  . Years of education: Not on file  . Highest education level: Not on file  Occupational History  . Not on file  Social Needs  . Financial resource strain: Not on file  . Food insecurity:    Worry: Not on file    Inability: Not on file  . Transportation needs:    Medical: Not on file    Non-medical: Not on file  Tobacco Use  . Smoking status: Never Smoker  . Smokeless tobacco: Never Used  Substance and Sexual Activity  . Alcohol use: Never    Frequency: Never  . Drug use: Never  . Sexual activity: Not on file  Lifestyle  . Physical activity:    Days per week: Not on file    Minutes per session: Not on file  . Stress: Not on file  Relationships  . Social connections:    Talks on phone: Not on file    Gets together: Not on file    Attends religious service: Not on file    Active member of club or organization: Not on file   Attends meetings of clubs or organizations: Not on file    Relationship status: Not on file  Other Topics Concern  . Not on file  Social History Narrative  . Not on file   History reviewed. No pertinent family history. Not on File Prior to Admission medications   Medication Sig Start Date End Date Taking? Authorizing Provider  LORazepam (ATIVAN) 0.5 MG tablet Take 0.5 mg by mouth every 12 (twelve) hours as needed. for anxiety 11/15/17  Yes [provider]  potassium chloride (MICRO-K) 10 MEQ CR capsule Take 10 mEq by mouth 2 (two) times daily. 03/21/17 03/21/18 Yes [provider]   Dg Hip Unilat W Or Wo Pelvis 2-3 Views Right  Result Date: 01/22/2018 CLINICAL DATA:  Persists right hip pain.  After fall 1 week ago EXAM: DG HIP (WITH OR WITHOUT PELVIS) 2-3V RIGHT COMPARISON:  CT abdomen pelvis dated March 15, 2012. FINDINGS: Acute minimally displaced fractures of the right superior and inferior pubic rami. No femur fracture. The pubic symphysis and sacroiliac joints are intact. Prior left total hip arthroplasty. Severe osteopenia. Soft tissues are unremarkable. IMPRESSION: Acute minimally displaced fractures of the right superior and inferior pubic rami. Electronically Signed   By: Obie DredgeWilliam T Derry  M.D.   On: 01/22/2018 15:51    Positive ROS: All other systems have been reviewed and were otherwise negative with the exception of those mentioned in the HPI and as above.  Physical Exam: General: Awake, no acute distress, patient with advanced dementia   MUSCULOSKELETAL:  Right wrist: She has a volar splint Ace wrap to her forearm.  Her fingers well-perfused.  Passive motion of her fingers causes her pain.  Sensation cannot be assessed.    Right lower extremity: Patient has palpable pedal pulses.  Sensation cannot be assessed.  There is no shortening or external rotation of her right lower extremity.  Patient has spontaneous movements to her toes both in flexion and  dorsiflexion.  Assessment: Right distal radius fracture with dorsal angulation Right nondisplaced superior and inferior pelvic rami fractures  Plan: Patient is seen with family at the bedside.  Patient is going to be discharged to long-term care tomorrow.  Patient is seen in the room with the social worker at the bedside.  I recommend changing her from a splint to a short arm cast tomorrow prior to her discharge.  She will follow-up in the office in 4 weeks for cast removal.  In regards to her pelvic fractures, patient will not require surgical intervention.  She may weight-bear as tolerated.  Patient may be transferred to a chair if medically appropriate.  Patient may benefit from physical therapy evaluation.  She will require a platform walker if she is able to stand or ambulate.  Will up in our office in 4 weeks for reevaluation of the pelvic fracture and removal of her cast.    Juanell FairlyKRASINSKI, Kaoru Benda, MD    01/23/2018 5:39 PM

## 2018-01-23 NOTE — Progress Notes (Signed)
Chaplain was paged by On call Chaplain from a  Requested visit by unit Diplomatic Services operational officersecretary. Pt is not able to communicate and suffers from advance demential. Chaplain maintained a pastoral presence while MD consulted with family. A palliative care consult will be submitted. Chaplain explained  Possible options to the family  And offered prayer. Chaplain prayed for comfort , peace and creators will for Pt and family.    01/23/18 1100  Clinical Encounter Type  Visited With Patient and family together  Visit Type Follow-up  Spiritual Encounters  Spiritual Needs Brochure;Prayer

## 2018-01-23 NOTE — Progress Notes (Signed)
[facility] [place] [facility]   PATIENT NAME: Dorothy Murphy    MR#:  [id number]  DATE OF BIRTH:  12/06/1928  SUBJECTIVE:  CHIEF COMPLAINT:   Chief Complaint  Patient presents with  . Failure To Thrive  confused, family at bedside  REVIEW OF SYSTEMS:  Review of Systems  Unable to perform ROS: Dementia    DRUG ALLERGIES:  Not on File VITALS:  Blood pressure (!) 119/46, pulse 70, temperature 97.7 F (36.5 C), temperature source Oral, resp. rate 16, height 5\' 4"  (1.626 m), weight 68 kg, SpO2 98 %. PHYSICAL EXAMINATION:  Physical Exam  Constitutional: She appears lethargic. She appears cachectic. She is uncooperative.  HENT:  Head: Normocephalic and atraumatic.  Eyes: Pupils are equal, round, and reactive to light. Conjunctivae and EOM are normal.  Neck: Normal range of motion. Neck supple. No tracheal deviation present. No thyromegaly present.  Cardiovascular: Normal rate, regular rhythm and normal heart sounds.  Pulmonary/Chest: Effort normal and breath sounds normal. No respiratory distress. She has no wheezes. She exhibits no tenderness.  Abdominal: Soft. Bowel sounds are normal. She exhibits no distension. There is no tenderness.  Musculoskeletal: Normal range of motion.  Neurological: She appears lethargic. She is disoriented. No cranial nerve deficit.  Skin: Skin is warm and dry. No rash noted.   LABORATORY PANEL:  Female CBC Recent Labs  Lab 01/23/18 0342  WBC 9.4  HGB 13.4  HCT 39.8  PLT 201   ------------------------------------------------------------------------------------------------------------------ Chemistries  Recent Labs  Lab 01/23/18 0342  NA 150*  K 4.7  CL 118*  CO2 28  GLUCOSE 140*  BUN 33*  CREATININE 0.79  CALCIUM 9.1  AST 23  ALT 20  ALKPHOS 71  BILITOT 1.1   RADIOLOGY:  No results found. ASSESSMENT AND PLAN:  Dorothy Murphy  is a 84 y.o. female with a known history of dementia with recent history of right-sided  wrist fracture is brought into the ED after she sustained a fall on Tuesday.  Since the fall patient was not walking, not eating or drinking which is concerning to the family members and patient is brought into the emergency department x-ray of the has revealed pubic ramus fracture on the right side.  #Acute cystitis Await urine culture and sensitivity and patient is started on empiric IV antibiotic Rocephin Gentle hydration with IV fluids  #Failure to thrive Patient stopped eating or drinking from last Tuesday Has baseline dementia and progressively getting worse  #Fall with right pubic ramus fracture Nonoperable Pain management as needed  #Dehydration from poor p.o. Intake Hydrate with IV fluids and monitor patient closely  # borderline diabetes mellitus  sliding scale insulin  # hypernatremia - could be due to dehydration, start NS and monitor  I recommend LTC with Hospice, STR/SNF is not a good choice for this elderly demented patient. Family agrees   All the records are reviewed and case discussed with Care Management/Social Worker. Management plans discussed with the patient, family (son & daughter in law at bedside) and they are in agreement.  CODE STATUS: DNR  TOTAL TIME TAKING CARE OF THIS PATIENT: 45 minutes.   More than 50% of the time was spent in counseling/coordination of care: YES  POSSIBLE D/C IN 1 DAYS, DEPENDING ON CLINICAL CONDITION.   Delfino LovettVipul Philisha Weinel M.D on 01/23/2018 at 7:50 PM  Between 7am to 6pm - Pager - [phone]  After 6pm go to [email or web address] - Therapist, nutritionalpassword EPAS [facility]  [facility]  Office  [phone]  CC: Primary care physician; Idelle Crouch, MD  Note: This dictation was prepared with Dragon dictation along with smaller phrase technology. Any transcriptional errors that result from this process are unintentional.

## 2018-01-23 NOTE — Clinical Social Work Placement (Signed)
   CLINICAL SOCIAL WORK PLACEMENT  NOTE  Date:  01/23/2018  Patient Details  Name: Dorothy Murphy MRN: [id number] Date of Birth: 03/05/1929  Clinical Social Work is seeking post-discharge placement for this patient at the Skilled  Nursing Facility level of care (*CSW will initial, date and re-position this form in  chart as items are completed):  Yes   Patient/family provided with [place] Clinical Social Work Department's list of facilities offering this level of care within the geographic area requested by the patient (or if unable, by the patient's family).  Yes   Patient/family informed of their freedom to choose among providers that offer the needed level of care, that participate in Medicare, Medicaid or managed care program needed by the patient, have an available bed and are willing to accept the patient.  Yes   Patient/family informed of 's ownership interest in [facility] and [facility], as well as of the fact that they are under no obligation to receive care at these facilities.  PASRR submitted to EDS on 01/22/18     PASRR number received on 01/22/18     Existing PASRR number confirmed on       FL2 transmitted to all facilities in geographic area requested by pt/family on 01/23/18     FL2 transmitted to all facilities within larger geographic area on       Patient informed that his/her managed care company has contracts with or will negotiate with certain facilities, including the following:        Yes   Patient/family informed of bed offers received.  Patient chooses bed at ([facility] )     Physician recommends and patient chooses bed at      Patient to be transferred to   on  .  Patient to be transferred to facility by       Patient family notified on   of transfer.  Name of family member notified:        PHYSICIAN       Additional Comment:    _______________________________________________ Rui Wordell, Darleen CrockerBailey M, LCSW 01/23/2018, 3:47 PM

## 2018-01-23 NOTE — Progress Notes (Signed)
[facility] following discharge received form CSW [employer] [facility] [employer] Sample. Patient information faxed ot referral, [facility]20. Writer to follow up with the family in the morning to provide education regarding hospice services. CSW Fredric MareBailey is aware. Thank you. Dayna BarkerKaren  Robertson RN, BSN, [facility],hospital liaison [phone]

## 2018-01-23 NOTE — Progress Notes (Signed)
Clinical Social Worker (CSW) met with patient and her 2 sons Delfino Lovett and Jerrye Beavers were at bedside. CSW made sons aware that per MD patient is not able to participate in PT and needs long term care. CSW explained long term care options under private pay because patient does not have medicaid. Per sons they are willing to pay privately for SNF. Sons reported that patient also has long term care insurance. CSW made sons aware that long term care insurance will likely not pay for SNF until she has been in a facility for 30-60 days. Sons verbalized their understanding. CSW presented bed offers. Sons chose [facility]. Sons are going to [facility] this afternoon to work out a Theme park manager. Tammy admissions coordinator at [facility] is aware of above. CSW will continue to follow and assist as needed.   [employer], LCSW [phone]

## 2018-01-23 NOTE — Consult Note (Signed)
Consultation Note Date: 01/23/18  Patient Name: Dorothy Murphy  DOB: [birth year]  MRN: [id number]  Age / Sex: 84 y.o., female  PCP: Idelle Crouch, MD Referring Physician: Max Sane, MD  Reason for Consultation: Establishing goals of care  HPI/Patient Profile: 84 y.o. female  with past medical history of dementia, falls, and hip replacement admitted on 01/22/2018 after a fall and with decreased oral intake. Patient found to have right pubis ramus fracture. Recent right wrist fracture. Started on IV Rocephin and IVF for acute cystitis and dehydration. Baseline dementia. Palliative medicine consultation for goals of care.    Clinical Assessment and Goals of Care:  I have reviewed medical records, discuss with care team, and met with patient and sons (Richard and Carrington) at bedside to discuss diagnosis, prognosis, GOC, EOL wishes, disposition and options. Patient alert but disoriented with baseline dementia. Unable to participate in conversation.   I introduced Palliative Medicine as specialized medical care for people living with serious illness. It focuses on providing relief from the symptoms and stress of a serious illness. The goal is to improve quality of life for both the patient and the family.  We discussed a brief life review of the patient. Widowed about 12 years ago. Sons speak of a decline in her health since then with ongoing dementia for the last 5-6 years. Prior to hospitalization, living home with son, Delfino Lovett. Richard and Goodland ensure their mother has 24 hour care. She cannot be left alone. Patient suffered a fall last week when wrist fracture was found. Prior to this fall, patient baseline able to ambulate with walker and feed self. Required assist with ADL's. Since her fall last week, patient has not gotten out of bed and with poor oral intake. Refusing food.   Discussed course of hospitalization  including hospital diagnoses and interventions. Educated on disease trajectory of dementia including signs of end-stage dementia. Sons understand chronic, progressive nature of dementia.   I attempted to elicit values and goals of care important to the patient and family. After conversation with Dr. Manuella Ghazi today, sons have a good understanding that she is not a candidate for rehab due to progressive dementia.   Advanced directives, concepts specific to code status, artifical feeding and hydration, and rehospitalization were considered and discussed. Introduced MOST form and encouraged sons to review and complete with a healthcare provider. The patient does not have a documented living will. Sons understand they are automatically decision makers for her since her husband is deceased.   The difference between aggressive medical intervention and comfort care was considered. Introduced role of outpatient palliative versus hospice. Sons considering hospice services.   Sons have a meeting at [employer] [employer] today to discuss long-term care for their mother. Encouraged them to ask about including hospice services at facility. They wish to take their mother home but understand she requires more assist than before the fall.    Questions and concerns were addressed.  Hard Choices booklet left for review. PMT contact information given.  SUMMARY OF RECOMMENDATIONS    DNR in the event of cardiac arrest. Otherwise continue current supportive care.   Sons have a good understanding of disease trajectory of dementia. They understand she is not a candidate for intense rehabilitation.   Introduced outpatient palliative/hospice options. Sons have a meeting at [facility] [employer] this afternoon. Considering hospice services at long-term care facility.   Code Status/Advance Care Planning:  DNR  Symptom Management:   Tramadol 33m PO q6h prn pain  Remeron 124mPO HS  Palliative Prophylaxis:   Aspiration, Bowel  Regimen, Delirium Protocol, Frequent Pain Assessment, Oral Care and Turn Reposition  Psycho-social/Spiritual:   Desire for further Chaplaincy support: yes  Additional Recommendations: Caregiving  Support/Resources and Education on Hospice  Prognosis:   Unable to determine guarded with baseline dementia and declining functional, cognitive, and nutritional status after fall. Eligible for hospice services.  Discharge Planning: To Be Determined      Primary Diagnoses: Present on Admission: . [place]  I have reviewed the medical record, interviewed the patient and family, and examined the patient. The following aspects are pertinent.  Past Medical History:  Diagnosis Date  . Advanced dementia    Social History   Socioeconomic History  . Marital status: Widowed    Spouse name: Not on file  . Number of children: Not on file  . Years of education: Not on file  . Highest education level: Not on file  Occupational History  . Not on file  Social Needs  . Financial resource strain: Not on file  . Food insecurity:    Worry: Not on file    Inability: Not on file  . Transportation needs:    Medical: Not on file    Non-medical: Not on file  Tobacco Use  . Smoking status: Never Smoker  . Smokeless tobacco: Never Used  Substance and Sexual Activity  . Alcohol use: Never    Frequency: Never  . Drug use: Never  . Sexual activity: Not on file  Lifestyle  . Physical activity:    Days per week: Not on file    Minutes per session: Not on file  . Stress: Not on file  Relationships  . Social connections:    Talks on phone: Not on file    Gets together: Not on file    Attends religious service: Not on file    Active member of club or organization: Not on file    Attends meetings of clubs or organizations: Not on file    Relationship status: Not on file  Other Topics Concern  . Not on file  Social History Narrative  . Not on file   History reviewed. No  pertinent family history. Scheduled Meds: . docusate  100 mg Oral BID  . enoxaparin (LOVENOX) injection  40 mg Subcutaneous Q24H  . mirtazapine  15 mg Oral QHS   Continuous Infusions: . sodium chloride 75 mL/hr at 01/23/18 2219   PRN Meds:.acetaminophen **OR** acetaminophen, LORazepam, ondansetron **OR** ondansetron (ZOFRAN) IV, traMADol Medications Prior to Admission:  Prior to Admission medications   Medication Sig Start Date End Date Taking? Authorizing Provider  LORazepam (ATIVAN) 0.5 MG tablet Take 0.5 mg by mouth every 12 (twelve) hours as needed. for anxiety 11/15/17  Yes [provider]  potassium chloride (MICRO-K) 10 MEQ CR capsule Take 10 mEq by mouth 2 (two) times daily. 03/21/17 03/21/18 Yes [provider]   Not on File Review of Systems  Unable to perform ROS:  Dementia   Physical Exam  Constitutional: She appears cachectic.  HENT:  Head: Normocephalic and atraumatic.  Pulmonary/Chest: No accessory muscle usage. No tachypnea. No respiratory distress.  Abdominal: There is no tenderness.  Musculoskeletal:  Right wrist wrapped  Neurological: She is alert. She is disoriented.  Skin: Skin is warm and dry.  Psychiatric: Her speech is delayed. Cognition and memory are impaired. She is inattentive.  Nursing note and vitals reviewed.   Vital Signs: BP 126/72 (BP Location: Left Arm)   Pulse (!) 102   Temp 97.7 F (36.5 C) (Oral)   Resp 20   Ht 5' 4"  (1.626 m)   Wt 68 kg   SpO2 100%   BMI 25.75 kg/m  Pain Scale: Faces POSS *See Group Information*: 1-Acceptable,Awake and alert Pain Score: 0-No pain   SpO2: SpO2: 100 % O2 Device:SpO2: 100 % O2 Flow Rate: .   IO: Intake/output summary:   Intake/Output Summary (Last 24 hours) at 01/24/2018 2409 Last data filed at 01/23/2018 1010 Gross per 24 hour  Intake 360 ml  Output -  Net 360 ml    LBM: Last BM Date: 01/23/18 Baseline Weight: Weight: 68 kg Most recent weight: Weight: 68 kg       Palliative Assessment/Data:  PPS 30%   Flowsheet Rows     Most Recent Value  Intake Tab  Referral Department  Hospitalist  Unit at Time of Referral  Med/Surg Unit  Palliative Care Primary Diagnosis  Neurology  Palliative Care Type  New Palliative care  Reason for referral  Clarify Goals of Care  Date first seen by Palliative Care  01/23/18  Clinical Assessment  Palliative Performance Scale Score  30%  Psychosocial & Spiritual Assessment  Palliative Care Outcomes  Patient/Family meeting held?  Yes  Who was at the meeting?  two sons  Palliative Care Outcomes  Clarified goals of care, Improved non-pain symptom therapy, Counseled regarding hospice, Provided psychosocial or spiritual support, ACP counseling assistance, Provided end of life care assistance      Time In: 1415 Time Out: 1515 Time Total: 62mn Greater than 50%  of this time was spent counseling and coordinating care related to the above assessment and plan.  Signed by:  MIhor Dow FNP-C Palliative Medicine Team  Phone: [phone]: [phone]  Please contact Palliative Medicine Team phone at [phone] questions and concerns.  For individual provider: See AShea Evans

## 2018-01-23 NOTE — Progress Notes (Signed)
Patient doesn't follow commands from her dementia. I can't imagine this individual is rehabable.  I don't think patient is appropriate for STR/SNF. Wouldn't have PT see her (SW Requests)  I had long d/w patient's family at bedside, they agree with long term care placement (with/without Hospice).  I've asked CSW/CM to look into long term care placement as family has set funds aside and willing to pay,. They also have long term care insurance for this patient.  Time spent: 20 mins

## 2018-01-24 DIAGNOSIS — E860 Dehydration: Secondary | ICD-10-CM

## 2018-01-24 DIAGNOSIS — Z7189 Other specified counseling: Secondary | ICD-10-CM | POA: Diagnosis not present

## 2018-01-24 DIAGNOSIS — R627 Adult failure to thrive: Secondary | ICD-10-CM | POA: Diagnosis not present

## 2018-01-24 DIAGNOSIS — Z515 Encounter for palliative care: Secondary | ICD-10-CM

## 2018-01-24 DIAGNOSIS — S32591A Other specified fracture of right pubis, initial encounter for closed fracture: Secondary | ICD-10-CM | POA: Diagnosis not present

## 2018-01-24 DIAGNOSIS — F0390 Unspecified dementia without behavioral disturbance: Secondary | ICD-10-CM | POA: Diagnosis not present

## 2018-01-24 DIAGNOSIS — N3000 Acute cystitis without hematuria: Secondary | ICD-10-CM

## 2018-01-24 DIAGNOSIS — M25551 Pain in right hip: Secondary | ICD-10-CM | POA: Diagnosis not present

## 2018-01-24 LAB — BASIC METABOLIC PANEL
ANION GAP: 6 (ref 5–15)
BUN: 27 mg/dL — ABNORMAL HIGH (ref 8–23)
CALCIUM: 8.7 mg/dL — AB (ref 8.9–10.3)
CO2: 24 mmol/L (ref 22–32)
Chloride: 116 mmol/L — ABNORMAL HIGH (ref 98–111)
Creatinine, Ser: 0.62 mg/dL (ref 0.44–1.00)
GFR calc Af Amer: >60 mL/min (ref >60)
GLUCOSE: 121 mg/dL — AB (ref 70–99)
Potassium: 3.7 mmol/L (ref 3.5–5.1)
SODIUM: 146 mmol/L — AB (ref 135–145)

## 2018-01-24 LAB — CBC
HCT: 36.7 % (ref 35.0–47.0)
HEMOGLOBIN: 12.6 g/dL (ref 12.0–16.0)
MCH: 30.5 pg (ref 26.0–34.0)
MCHC: 34.3 g/dL (ref 32.0–36.0)
MCV: 88.7 fL (ref 80.0–100.0)
Platelets: 202 10*3/uL (ref 150–440)
RBC: 4.14 MIL/uL (ref 3.80–5.20)
RDW: 13.5 % (ref 11.5–14.5)
WBC: 9.7 10*3/uL (ref 3.6–11.0)

## 2018-01-24 MED ORDER — LORAZEPAM 0.5 MG PO TABS: 0.5000 mg | ORAL_TABLET | Freq: Two times a day (BID) | ORAL | 0 refills | Status: AC | PRN

## 2018-01-24 MED ORDER — TRAMADOL HCL 50 MG PO TABS: 50.0000 mg | ORAL_TABLET | Freq: Four times a day (QID) | ORAL | 0 refills | Status: DC | PRN

## 2018-01-24 MED ORDER — MIRTAZAPINE 15 MG PO TBDP: 15.0000 mg | ORAL_TABLET | Freq: Every day | ORAL | 0 refills | Status: AC

## 2018-01-24 MED ORDER — LORAZEPAM 0.5 MG PO TABS: 0.5000 mg | ORAL_TABLET | Freq: Two times a day (BID) | ORAL | 5 refills | Status: DC | PRN

## 2018-01-24 MED ORDER — TRAMADOL HCL 50 MG PO TABS: 50.0000 mg | ORAL_TABLET | Freq: Four times a day (QID) | ORAL | 0 refills | Status: AC | PRN

## 2018-01-24 MED ORDER — MIRTAZAPINE 15 MG PO TBDP
15.0000 mg | ORAL_TABLET | Freq: Every day | ORAL | Status: DC
  Filled 2018-01-24: qty 1

## 2018-01-24 MED ORDER — MIRTAZAPINE 15 MG PO TBDP: 15.0000 mg | ORAL_TABLET | Freq: Every day | ORAL | 0 refills | Status: DC

## 2018-01-24 NOTE — Discharge Summary (Addendum)
[facility] [place] [facility]   PATIENT NAME: Dorothy Murphy    MR#:  [id number]  DATE OF BIRTH:  12/01/1928  DATE OF ADMISSION:  01/22/2018   ADMITTING PHYSICIAN: Ramonita LabAruna Gouru, MD  DATE OF DISCHARGE: 01/24/2018  PRIMARY CARE PHYSICIAN: Marguarite ArbourSparks, Jeffrey D, MD   ADMISSION DIAGNOSIS:  Dehydration [E86.0] Failure to thrive in adult [R62.7] Closed fracture of ramus of right pubis, initial encounter (HCC) [S32.591A] DISCHARGE DIAGNOSIS:  Active Problems:   Pubic ramus fracture (HCC)   Palliative care by specialist   Goals of care, counseling/discussion   Dementia without behavioral disturbance   Dehydration   Failure to thrive in adult   Acute cystitis without hematuria  SECONDARY DIAGNOSIS:   Past Medical History:  Diagnosis Date  . Advanced dementia    HOSPITAL COURSE:  William HamburgerFayeWaseis a88 y.o.femalewith a known history of dementia with recent history of right-sided wrist fracture admitted after she sustained a fall on Tuesday. Since the fall patient was not walking, not eating or drinking which is concerning to the family members and patient is brought into the emergency department x-ray of the has revealed pubic ramus fracture on the right side.  #Acute cystitis: treated with Abx while in the Hospital Urine c/s had no growth.   #Failure to thrive  #Fall with right pubic ramus fracture Nonoperable Pain management as needed  #Dehydration from poor p.o. Intake  # borderline diabetes mellitus  # hypernatremia - resolved with hydration DISCHARGE CONDITIONS:  stable CONSULTS OBTAINED:  Treatment Team:  Juanell FairlyKrasinski, Kevin, MD DRUG ALLERGIES:  Not on File DISCHARGE MEDICATIONS:   Allergies as of 01/24/2018   Not on File     Medication List    STOP taking these medications   potassium chloride 10 MEQ CR capsule Commonly known as:  MICRO-K     TAKE these medications   LORazepam 0.5 MG tablet Commonly known as:  ATIVAN Take 1 tablet  (0.5 mg total) by mouth every 12 (twelve) hours as needed. for anxiety   mirtazapine 15 MG disintegrating tablet Commonly known as:  REMERON SOL-TAB Take 1 tablet (15 mg total) by mouth at bedtime.   traMADol 50 MG tablet Commonly known as:  ULTRAM Take 1 tablet (50 mg total) by mouth every 6 (six) hours as needed for moderate pain.        DISCHARGE INSTRUCTIONS:  Ortho has applied a short arm cast to the patient's right wrist today. Weightbearing as tolerated on right lower extremity.  Patient will follow-up in ortho office in 2 weeks for cast check.  Patient should use a platform walker with physical therapy.  She should not weight-bear through her right wrist. DIET:  Regular diet DISCHARGE CONDITION:  Fair ACTIVITY:  Activity as tolerated OXYGEN:  Home Oxygen: No.  Oxygen Delivery: room air DISCHARGE LOCATION:  [facility] (long term) with Hospice  If you experience worsening of your admission symptoms, develop shortness of breath, life threatening emergency, suicidal or homicidal thoughts you must seek medical attention immediately by calling 911 or calling your MD immediately  if symptoms less severe.  You Must read complete instructions/literature along with all the possible adverse reactions/side effects for all the Medicines you take and that have been prescribed to you. Take any new Medicines after you have completely understood and accpet all the possible adverse reactions/side effects.   Please note  You were cared for by a hospitalist during your hospital stay. If you have any questions about your discharge medications  or the care you received while you were in the hospital after you are discharged, you can call the unit and asked to speak with the hospitalist on call if the hospitalist that took care of you is not available. Once you are discharged, your primary care physician will handle any further medical issues. Please note that NO REFILLS for any discharge  medications will be authorized once you are discharged, as it is imperative that you return to your primary care physician (or establish a relationship with a primary care physician if you do not have one) for your aftercare needs so that they can reassess your need for medications and monitor your lab values.    On the day of Discharge:  VITAL SIGNS:  Blood pressure 126/72, pulse (!) 102, temperature 97.7 F (36.5 C), temperature source Oral, resp. rate 20, height 5\' 4"  (1.626 m), weight 68 kg, SpO2 100 %. PHYSICAL EXAMINATION:  GENERAL:  84 y.o.-year-old patient lying in the bed with no acute distress.  EYES: Pupils equal, round, reactive to light and accommodation. No scleral icterus. Extraocular muscles intact.  HEENT: Head atraumatic, normocephalic. Oropharynx and nasopharynx clear.  NECK:  Supple, no jugular venous distention. No thyroid enlargement, no tenderness.  LUNGS: Normal breath sounds bilaterally, no wheezing, rales,rhonchi or crepitation. No use of accessory muscles of respiration.  CARDIOVASCULAR: S1, S2 normal. No murmurs, rubs, or gallops.  ABDOMEN: Soft, non-tender, non-distended. Bowel sounds present. No organomegaly or mass.  EXTREMITIES: No pedal edema, cyanosis, or clubbing.  NEUROLOGIC: Cranial nerves II through XII are intact. Muscle strength 5/5 in all extremities. Sensation intact. Gait not checked.  PSYCHIATRIC: The patient is alert and oriented x 3.  SKIN: No obvious rash, lesion, or ulcer.  DATA REVIEW:   CBC Recent Labs  Lab 01/24/18 0357  WBC 9.7  HGB 12.6  HCT 36.7  PLT 202    Chemistries  Recent Labs  Lab 01/23/18 0342 01/24/18 0357  NA 150* 146*  K 4.7 3.7  CL 118* 116*  CO2 28 24  GLUCOSE 140* 121*  BUN 33* 27*  CREATININE 0.79 0.62  CALCIUM 9.1 8.7*  AST 23  --   ALT 20  --   ALKPHOS 71  --   BILITOT 1.1  --       Contact information for follow-up providers    Marguarite ArbourSparks, Jeffrey D, MD. Schedule an appointment as soon as possible  for a visit in 5 days.   Specialty:  Internal Medicine Why:  Per April of Dr. Wendee CoppSpark's office; Patient will be seen by a Physician at [facility]. Contact information: [address] [facility] [place] [address] [phone]            Contact information for after-discharge care    Destination    [facility] Preferred [facility] .   Service:  Skilled Nursing Contact information: [address] [place] [phone]                  Management plans discussed with the patient, family (son at bedside) and they are in agreement.  CODE STATUS: DNR   TOTAL TIME TAKING CARE OF THIS PATIENT: 45 minutes.    Delfino LovettVipul Annina Piotrowski M.D on 01/24/2018 at 11:34 AM  Between 7am to 6pm - Pager - [phone]  After 6pm go to [email or web address] - Social research officer, governmentpassword [email or web address] [email or web address]  [facility] [place] [facility]  Office  [phone]  CC: Primary care physician; Marguarite ArbourSparks, Jeffrey D, MD   Note:  This dictation was prepared with Dragon dictation along with smaller phrase technology. Any transcriptional errors that result from this process are unintentional.

## 2018-01-24 NOTE — Progress Notes (Signed)
Follow up visit made to new referral for [facility] services at [facility]. Patient is an 84 year old woman with a known history of dementia admitted to [facility] on 8/18 with with recent history of right-sided wrist fracture is brought into the ED after she sustained a fall on Tuesday.  Since the fall patient was not walking, not eating or drinking in the ED  x-ray revealed a pubic ramus fracture on the right side.  She was admitted for treatment of dehydration and possible UTI. Palliative medicine was consulted and met with patient's son's yesterday. They have chosen for patient to discharge to [facility] with the support of hospice services. Writer met in the room with patient's sons Jerrye Beavers and Liliane Channel to initiate education regarding hospice services, philosophy and team approach to care with understanding voiced. Plan is for patient to discharge today via EMS following the placement of a right wrist cast. Hospice information and contact numbers given to [facility]. Patient seen lying in bed, mumbling, mitts in place as patient had removed her soft cast overnight. Patient seen with grimacing and did speak of leg pain. Staff RN Ackerman notified and will attempt to give PRN tylenol. Both sons in agreement. Patient information faxed to referral. Will continue to follow through final disposition. Thank you for the opportunity to be involved in the care of this patient and her family. Flo Shanks RN, BSN, [facility], hospital Liaison [phone]

## 2018-01-24 NOTE — Progress Notes (Signed)
Report given to Konrad DoloresKim Hicks RN, EMS called for transportation.

## 2018-01-24 NOTE — Progress Notes (Signed)
Daily Progress Note   Patient Name: Dorothy Murphy       Date: 01/24/18 DOB: 04/30/1929  Age: 84 y.o. MRN#: [id number] Attending Physician: Delfino LovettShah, Vipul, MD Primary Care Physician: Marguarite ArbourSparks, Jeffrey D, MD Admit Date: 01/22/2018  Reason for Consultation/Follow-up: Establishing goals of care and Terminal Care  Subjective: Patient awake but disoriented. Ate bites of breakfast. No s/s of pain or discomfort.   Two sons at bedside. Reviewed course of hospitalization including diagnoses, interventions, and guarded prognosis with progressive dementia--now worse after the fall. Reviewed plan for discharge to SNF with hospice services. Again educated on hospice philosophy. Answered questions and concerns.   Length of Stay: 2  Current Medications: Scheduled Meds:  . docusate  100 mg Oral BID  . enoxaparin (LOVENOX) injection  40 mg Subcutaneous Q24H  . mirtazapine  15 mg Oral QHS    Continuous Infusions: . sodium chloride 75 mL/hr at 01/23/18 2219   PRN Meds: acetaminophen **OR** acetaminophen, LORazepam, ondansetron **OR** ondansetron (ZOFRAN) IV, traMADol  Physical Exam  Constitutional: She appears ill.  HENT:  Head: Normocephalic and atraumatic.  Pulmonary/Chest: No accessory muscle usage. No tachypnea. No respiratory distress.  Neurological: She is alert. She is disoriented.  Skin: Skin is warm and dry. There is pallor.  Psychiatric: Her speech is delayed. Cognition and memory are impaired. She is inattentive.  Nursing note and vitals reviewed.          Vital Signs: BP 126/72 (BP Location: Left Arm)   Pulse (!) 102   Temp 97.7 F (36.5 C) (Oral)   Resp 20   Ht 5\' 4"  (1.626 m)   Wt 68 kg   SpO2 100%   BMI 25.75 kg/m  SpO2: SpO2: 100 % O2 Device: O2 Device: Room Air O2 Flow Rate:      Intake/output summary: No intake or output data in the 24 hours ending 01/24/18 1107 LBM: Last BM Date: 01/23/18 Baseline Weight: Weight: 68 kg Most recent weight: Weight: 68 kg       Palliative Assessment/Data: PPS 30%   Flowsheet Rows     Most Recent Value  Intake Tab  Referral Department  Hospitalist  Unit at Time of Referral  Med/Surg Unit  Palliative Care Primary Diagnosis  Neurology  Palliative Care Type  New Palliative care  Reason for referral  Clarify Goals of Care  Date first seen by Palliative Care  01/23/18  Clinical Assessment  Palliative Performance Scale Score  30%  Psychosocial & Spiritual Assessment  Palliative Care Outcomes  Patient/Family meeting held?  Yes  Who was at the meeting?  two sons  Palliative Care Outcomes  Clarified goals of care, Improved non-pain symptom therapy, Counseled regarding hospice, Provided psychosocial or spiritual support, ACP counseling assistance, Provided end of life care assistance      Patient Active Problem List   Diagnosis Date Noted  . Palliative care by specialist   . Goals of care, counseling/discussion   . Dementia without behavioral disturbance   . Dehydration   . Failure to thrive in adult   . Acute cystitis without hematuria   . Pubic ramus fracture (HCC) 01/22/2018    Palliative Care Assessment & Plan   Patient Profile: 83 y.o. female  with past medical history of dementia, falls, and hip replacement admitted on 01/22/2018 after a fall and with decreased oral intake. Patient found to have right pubis ramus fracture. Recent right wrist fracture. Started on IV Rocephin and IVF for acute cystitis and dehydration. Baseline dementia. Palliative medicine consultation for goals of care.    Assessment: Dementia Fall Right wrist fracture Pubis ramus fracture Acute cystitis Dehydration  Recommendations/Plan:  Comfort focused care.   DNR/DNI  Recommend continuing tramadol prn pain.  Recommend starting  Remeron 7.5 PO HS.  Plan is for discharge to LTC facility today with hospice services.   Goals of Care and Additional Recommendations:  Limitations on Scope of Treatment: Full Comfort Care  Code Status:  DNR/DNI  Code Status Orders  (From admission, onward)         Start     Ordered   01/22/18 1734  Do not attempt resuscitation (DNR)  Continuous    Question Answer Comment  In the event of cardiac or respiratory ARREST Do not call a "code blue"   In the event of cardiac or respiratory ARREST Do not perform Intubation, CPR, defibrillation or ACLS   In the event of cardiac or respiratory ARREST Use medication by any route, position, wound care, and other measures to relive pain and suffering. May use oxygen, suction and manual treatment of airway obstruction as needed for comfort.   Comments RN may pronounce      01/22/18 1733        Code Status History    This patient has a current code status but no historical code status.    Advance Directive Documentation     Most Recent Value  Type of Advance Directive  Healthcare Power of Attorney  Pre-existing out of facility DNR order (yellow form or pink MOST form)  -  "MOST" Form in Place?  -       Prognosis:   Unable to determine: Poor long-term prognosis with underlying dementia and declining functional, nutritional and cognitive status s/p fall.   Discharge Planning:  Skilled Nursing Facility with Hospice  Care plan was discussed with sons at bedside, Dr Sherryll Burger  Thank you for allowing the Palliative Medicine Team to assist in the care of this patient.   Time In: 0915 Time Out: 0930 Total Time Prolonged Time Billed  no      Greater than 50%  of this time was spent counseling and coordinating care related to the above assessment and plan.  Vennie Homans, FNP-C Palliative Medicine Team  Phone: [phone] Fax: [phone]  Please contact Palliative Medicine Team phone  at [phone] for questions and concerns.

## 2018-01-24 NOTE — Discharge Instructions (Signed)
Hospice Introduction Hospice is a service that is designed to provide people who are terminally ill and their families with medical, spiritual, and psychological support. Its aim is to improve your quality of life by keeping you as alert and comfortable as possible. Who will be my providers when I begin hospice care? Hospice teams often include:  A nurse.  A doctor. The hospice doctor will be available for your care, but you can bring your regular doctor or nurse practitioner.  Social workers.  Religious leaders (such as a Clinical biochemist).  Trained volunteers.  What roles will providers play in my care? Hospice is performed by a team of health care professionals and volunteers who:  Help keep you comfortable: ? Hospice can be provided in your home or in a homelike setting. ? The hospice staff works with your family and friends to help meet your needs. ? You will enjoy the support of loved ones by receiving much of your basic care from family and friends.  Provide pain relief and manage your symptoms. The staff supply all necessary medicines and equipment.  Provide companionship when you are alone.  Allow you and your family to rest. They may do light housekeeping, prepare meals, and run errands.  Provide counseling. They will make sure your emotional, spiritual, and social needs and those of your family are being met.  Provide spiritual care: ? Spiritual care will be individualized to meet your needs and your family's needs. ? Spiritual care may involve:  Helping you look at what death means to you.  Helping you say goodbye to your family and friends.  Performing a specific religious ceremony or ritual.  When should hospice care begin? Most people who use hospice are believed to have fewer than 6 months to live.  Your family and health care providers can help you decide when hospice services should begin.  If your condition improves, you may discontinue the program.  What  should I consider before selecting a program? Most hospice programs are run by nonprofit, independent organizations. Some are affiliated with hospitals, nursing homes, or home health care agencies. Hospice programs can take place in the home or at a hospice center, hospital, or skilled nursing facility. When choosing a hospice program, ask the following questions:  What services are available to me?  What services will be offered to my loved ones?  How involved will my loved ones be?  How involved will my health care provider be?  Who makes up the hospice care team? How are they trained or screened?  How will my pain and symptoms be managed?  If my circumstances change, can the services be provided in a different setting, such as my home or in the hospital?  Is the program reviewed and licensed by the state or certified in some other way?  Where can I learn more about hospice? You can learn about existing hospice programs in your area from your health care providers. You can also read more about hospice online. The websites of the following organizations contain helpful information:  The [facility] and Palliative Care Organization [facility]).  The Hospice Association of America ([place]).  The [place].  The American Cancer Society (ACS).  Hospice Net.  This information is not intended to replace advice given to you by your health care provider. Make sure you discuss any questions you have with your health care provider. Document Released: 09/10/2003 Document Revised: 01/08/2016 Document Reviewed: 04/03/2013 Elsevier Interactive Patient Education  2017 [employer].

## 2018-01-24 NOTE — Progress Notes (Signed)
Merry Proud administrator at [facility] came to [facility] yesterday afternoon to visit patient and family. Merry Proud reported that patient can come to [facility] under private pay with hospice. Clinical Social Worker (CSW) met with patient's sons Delfino Lovett and Jerrye Beavers and made them aware of above. CSW also provided hospice agency choice. Sons chose [facility]. Tammy admissions coordinator at [facility] is aware of above. [facility] liaison is aware of above.   [employer], LCSW [phone]

## 2018-01-24 NOTE — Progress Notes (Signed)
Subjective:  Patient confused.  Family at the bedside.  Patient lying in bed and comfortable.  Patient is in no acute distress.  Her hands are both in mitts.  Objective:   VITALS:   Vitals:   01/22/18 2321 01/23/18 0744 01/23/18 2355 01/24/18 0726  BP: (!) 115/55 (!) 119/46 (!) 138/58 126/72  Pulse: 79 70 73 (!) 102  Resp: 16 16 19 20   Temp: 98 F (36.7 C) 97.7 F (36.5 C) 98.5 F (36.9 C) 97.7 F (36.5 C)  TempSrc: Oral Oral  Oral  SpO2: 99% 98% 100%   Weight:      Height:        PHYSICAL EXAM: Right forearm/wrist: Skin is intact.  Patient has resolving ecchymosis.  She has swelling over the right wrist with a slight dorsal angular deformity.  Patient has spontaneous flexion extension of her hands.  Fingers well-perfused and she has a palpable radial pulse.  She has tenderness over the distal radius.  Sensation difficult to assess based on her dementia.  Lower extremity: Skin intact.  Neurovascularly intact.  Sensation difficult to assess based on her dementia.   LABS  Results for orders placed or performed during the hospital encounter of 01/22/18 (from the past 24 hour(s))  Basic metabolic panel     Status: Abnormal   Collection Time: 01/24/18  3:57 AM  Result Value Ref Range   Sodium 146 (H) 135 - 145 mmol/L   Potassium 3.7 3.5 - 5.1 mmol/L   Chloride 116 (H) 98 - 111 mmol/L   CO2 24 22 - 32 mmol/L   Glucose, Bld 121 (H) 70 - 99 mg/dL   BUN 27 (H) 8 - 23 mg/dL   Creatinine, Ser 2.950.62 0.44 - 1.00 mg/dL   Calcium 8.7 (L) 8.9 - 10.3 mg/dL   GFR calc non Af Amer >60 >60 mL/min   GFR calc Af Amer >60 >60 mL/min   Anion gap 6 5 - 15  CBC     Status: None   Collection Time: 01/24/18  3:57 AM  Result Value Ref Range   WBC 9.7 3.6 - 11.0 K/uL   RBC 4.14 3.80 - 5.20 MIL/uL   Hemoglobin 12.6 12.0 - 16.0 g/dL   HCT 62.136.7 30.835.0 - 65.747.0 %   MCV 88.7 80.0 - 100.0 fL   MCH 30.5 26.0 - 34.0 pg   MCHC 34.3 32.0 - 36.0 g/dL   RDW 84.613.5 96.211.5 - 95.214.5 %   Platelets 202 150 - 440  K/uL    Dg Hip Unilat W Or Wo Pelvis 2-3 Views Right  Result Date: 01/22/2018 CLINICAL DATA:  Persists right hip pain.  After fall 1 week ago EXAM: DG HIP (WITH OR WITHOUT PELVIS) 2-3V RIGHT COMPARISON:  CT abdomen pelvis dated March 15, 2012. FINDINGS: Acute minimally displaced fractures of the right superior and inferior pubic rami. No femur fracture. The pubic symphysis and sacroiliac joints are intact. Prior left total hip arthroplasty. Severe osteopenia. Soft tissues are unremarkable. IMPRESSION: Acute minimally displaced fractures of the right superior and inferior pubic rami. Electronically Signed   By: Obie DredgeWilliam T Derry M.D.   On: 01/22/2018 15:51    Assessment/Plan:     Active Problems:   Pubic ramus fracture (HCC)   Palliative care by specialist   Goals of care, counseling/discussion   Dementia without behavioral disturbance   Dehydration   Failure to thrive in adult   Acute cystitis without hematuria  I applied a short arm cast to the patient's  right wrist today.  Being discharged to skilled nursing facility today.  Patient will be weightbearing as tolerated on right lower extremity.  Patient will follow-up in our office in 2 weeks for cast check.  Patient should use a platform walker with physical therapy.  She should not weight-bear through her right wrist.    Juanell FairlyKRASINSKI, [place] Spirito , MD 01/24/2018, 1:44 PM

## 2018-01-24 NOTE — Progress Notes (Signed)
Patient is medically stable for D/C to [facility] today under private pay [facility]. Per Tammy admissions coordinator at [facility] patient can come today to room 603. RN will call report and arrange EMS for transport. Clinical Child psychotherapistocial Worker (CSW) sent D/C orders to [facility] via [facility]. Patient's sons Sharl MaMarty and Gerlene BurdockRichard are at bedside and aware of above. [facility] is aware of above. Please reconsult if future social work needs arise. CSW signing off.   [employer]Bailey Pollie Poma, LCSW [phone]

## 2018-01-24 NOTE — Clinical Social Work Placement (Signed)
   CLINICAL SOCIAL WORK PLACEMENT  NOTE  Date:  01/24/2018  Patient Details  Name: Dorothy Murphy MRN: [id number] Date of Birth: 06/20/1928  Clinical Social Work is seeking post-discharge placement for this patient at the Skilled  Nursing Facility level of care (*CSW will initial, date and re-position this form in  chart as items are completed):  Yes   Patient/family provided with [facility] Clinical Social Work Department's list of facilities offering this level of care within the geographic area requested by the patient (or if unable, by the patient's family).  Yes   Patient/family informed of their freedom to choose among providers that offer the needed level of care, that participate in Medicare, Medicaid or managed care program needed by the patient, have an available bed and are willing to accept the patient.  Yes   Patient/family informed of [facility]'s ownership interest [facility] and [facility], as well as of the fact that they are under no obligation to receive care at these facilities.  PASRR submitted to EDS on 01/22/18     PASRR number received on 01/22/18     Existing PASRR number confirmed on       FL2 transmitted to all facilities in geographic area requested by pt/family on 01/23/18     FL2 transmitted to all facilities within larger geographic area on       Patient informed that his/her managed care company has contracts with or will negotiate with certain facilities, including the following:        Yes   Patient/family informed of bed offers received.  Patient chooses bed at ([facility] )     Physician recommends and patient chooses bed at      Patient to be transferred to ([facility] ) on 01/24/18.  Patient to be transferred to facility by [facility] )     Patient family notified on 01/24/18 of transfer.  Name of family member notified:  (Patient's sons Gerlene BurdockRichard and Sharl MaMarty are at bedside and aware of D/C today. )     PHYSICIAN        Additional Comment:    _______________________________________________ Armonii Sieh, Darleen CrockerBailey M, LCSW 01/24/2018, 1:52 PM

## 2018-01-24 NOTE — Progress Notes (Signed)
Attempted to call [facility]. No answer. EMS called for transportation.

## 2018-01-24 NOTE — Progress Notes (Signed)
EMS here to transport pt. 

## 2018-03-07 DEATH — deceased

## 2018-10-04 IMAGING — CT CT CERVICAL SPINE W/O CM
4 of 11 series · 8 of 33 positions shown, 9 images · non-contrast
Comparison: 06/27/2014 head CT.

CLINICAL DATA: Fall this morning, unwitnessed.  Advanced dementia.

EXAM:
CT HEAD WITHOUT CONTRAST
CT CERVICAL SPINE WITHOUT CONTRAST
TECHNIQUE: Multidetector CT imaging of the head and cervical spine was
performed following the standard protocol without intravenous
contrast. Multiplanar CT image reconstructions of the cervical spine
were also generated.

[Series 11: c spine soft · axial · 0.28mm/px · z∈[-241,-183]mm · 2 of 84 slices shown (1 of 2)]
[im 28/84  soft-tissue]
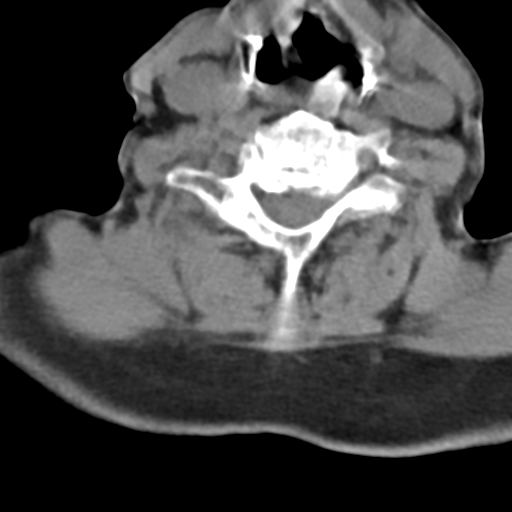
[im 56/84  soft-tissue]
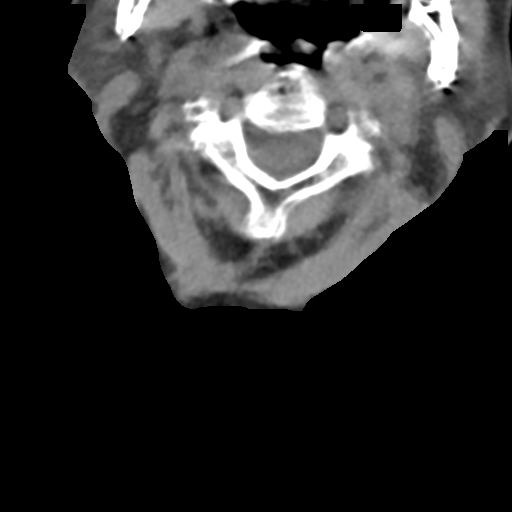

[Series 15: c spine soft · axial · 0.29mm/px · z∈[-239,-183]mm · 2 of 85 slices shown (2 of 2)]
[im 29/85  soft-tissue]
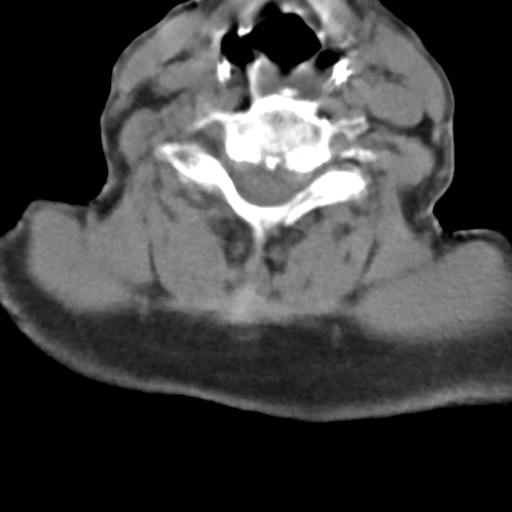
[im 57/85  soft-tissue]
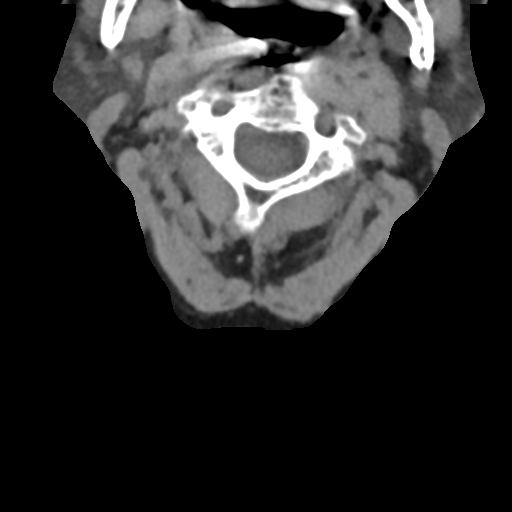

[Series 18: sagittal bone · sagittal · 0.28mm/px · 2 of 45 slices shown]
[im 15/45  bone]
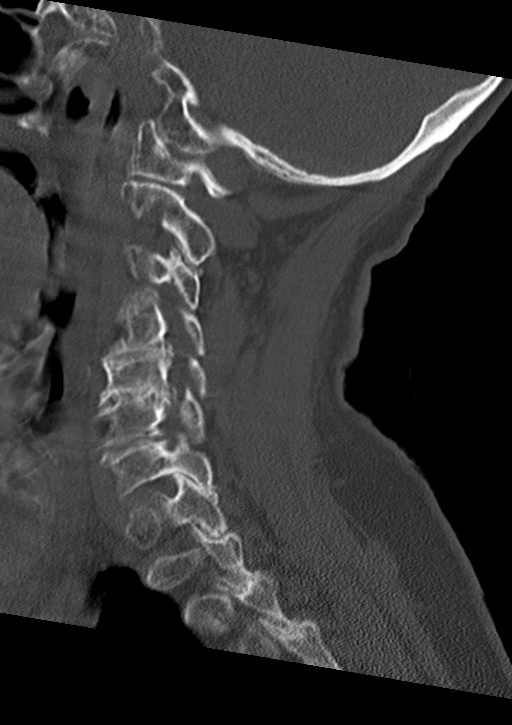
[im 30/45  bone]
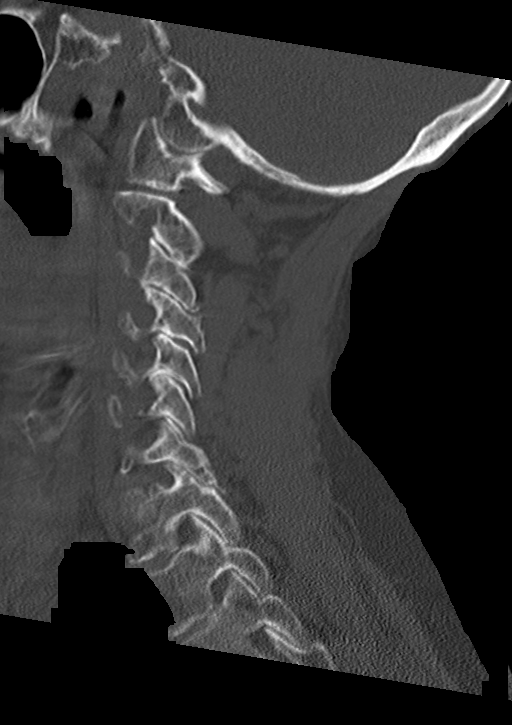

[Series 20: orthogonal bone · axial · 0.23mm/px · z∈[-254,-200]mm · 2 of 86 slices shown, 3 images]
[im 29/86  soft-tissue]
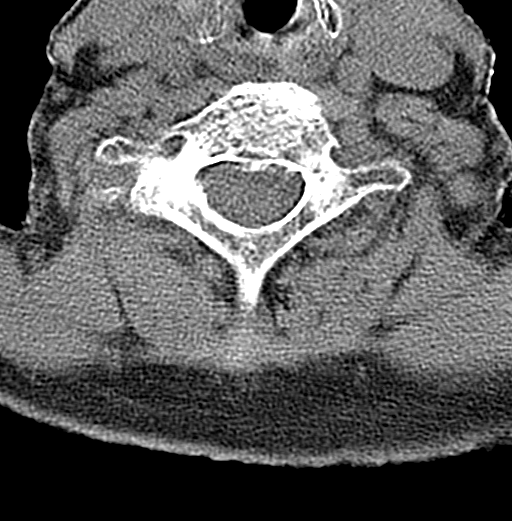
[im 29/86  bone]
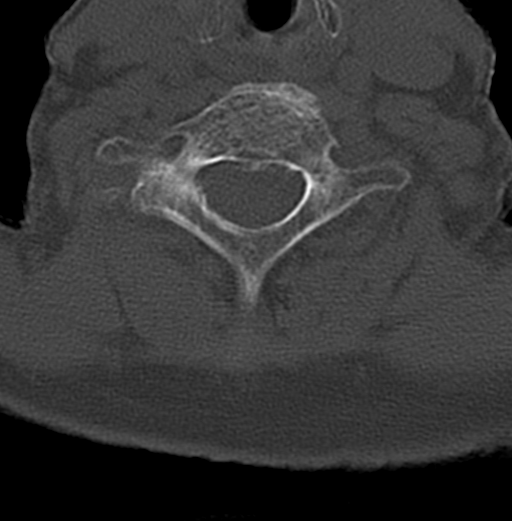
[im 57/86  bone]
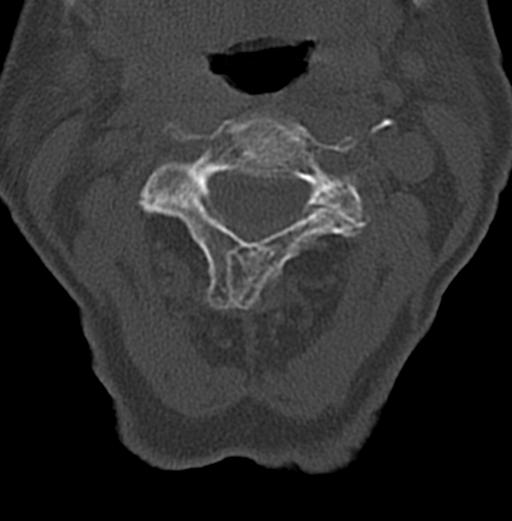

[8 of 33 positions shown; findings below may reference images not displayed]

FINDINGS: CT HEAD FINDINGS

Limited motion degraded scans.

Brain: No evidence of parenchymal hemorrhage or extra-axial fluid
collection. No mass lesion, mass effect, or midline shift. No CT
evidence of acute infarction. Generalized cerebral volume loss.
Nonspecific mild subcortical and periventricular white matter
hypodensity, most in keeping with chronic small vessel ischemic
change. No ventriculomegaly.

Vascular: No acute abnormality.

Skull: No evidence of calvarial fracture.

Sinuses/Orbits: The visualized paranasal sinuses are essentially
clear.

Other:  The mastoid air cells are unopacified.

CT CERVICAL SPINE FINDINGS

Limited motion degraded scans.

Alignment: Normal cervical lordosis. No facet subluxation. Dens is
well positioned between the lateral masses of C1.

Skull base and vertebrae: No acute fracture. No primary bone lesion
or focal pathologic process.

Soft tissues and spinal canal: No prevertebral edema. No visible
canal hematoma.

Disc levels: Advanced multilevel cervical degenerative disc disease
in the mid to lower cervical spine, most prominent at C4-5 and C5-6.
Moderate bilateral facet arthropathy. Moderate degenerative
foraminal stenosis on the right at C3-4. Mild degenerative foraminal
stenosis bilaterally at C4-5. Moderate degenerate foraminal stenosis
on the left at C5-6.

Upper chest: No acute abnormality.

Other: Visualized mastoid air cells appear clear. No discrete
thyroid nodules. No pathologically enlarged cervical nodes.
IMPRESSION: CT HEAD:

1. Limited motion degraded scan. No evidence of acute intracranial
abnormality. No evidence of calvarial fracture.
2. Generalized cerebral volume loss and mild chronic small vessel
ischemic changes in the cerebral white matter.

CT CERVICAL SPINE:

1. Limited motion degraded scan. No evidence of cervical spine
fracture or subluxation.
2. Advanced multilevel cervical degenerative changes as detailed.

## 2018-10-09 IMAGING — CR DG HIP (WITH OR WITHOUT PELVIS) 2-3V*R*
3 series · 3 of 3 positions shown · non-contrast
Comparison: CT abdomen pelvis dated March 15, 2012.

CLINICAL DATA: Persists right hip pain.  After fall 1 week ago

EXAM:
DG HIP (WITH OR WITHOUT PELVIS) 2-3V RIGHT

[pelvis ap]
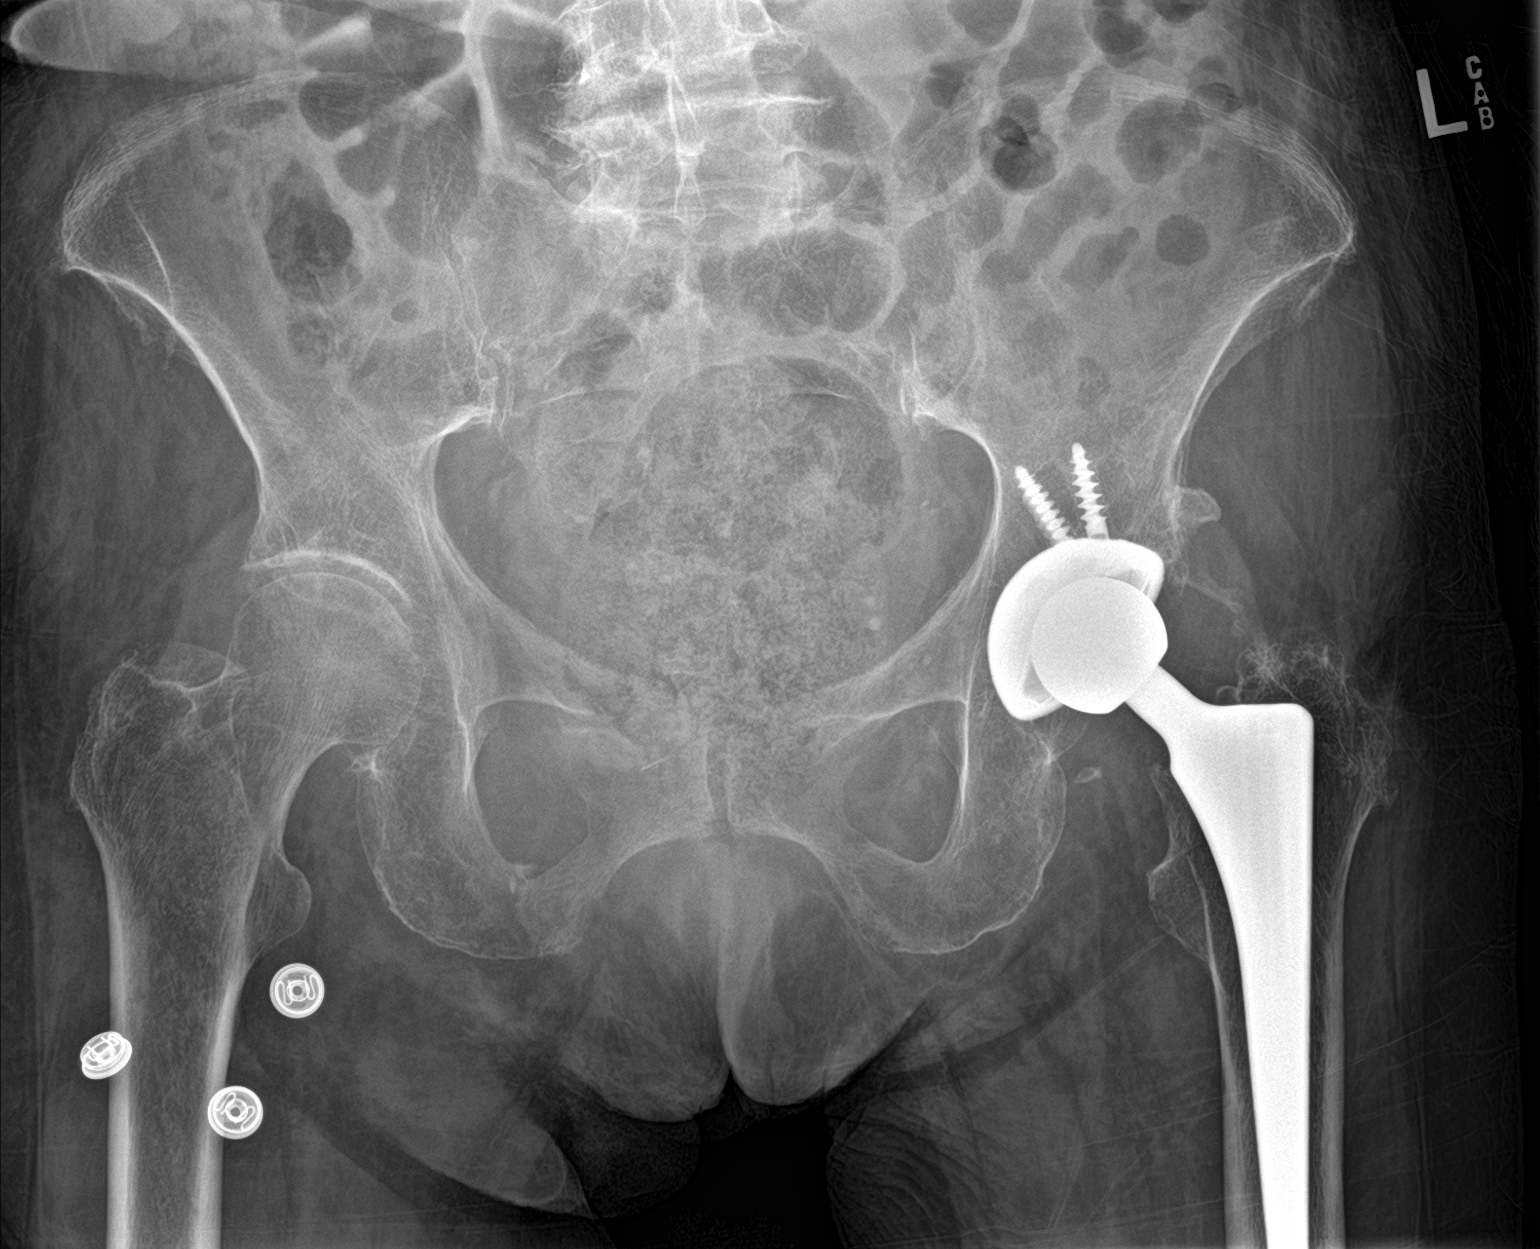

[hip ap]
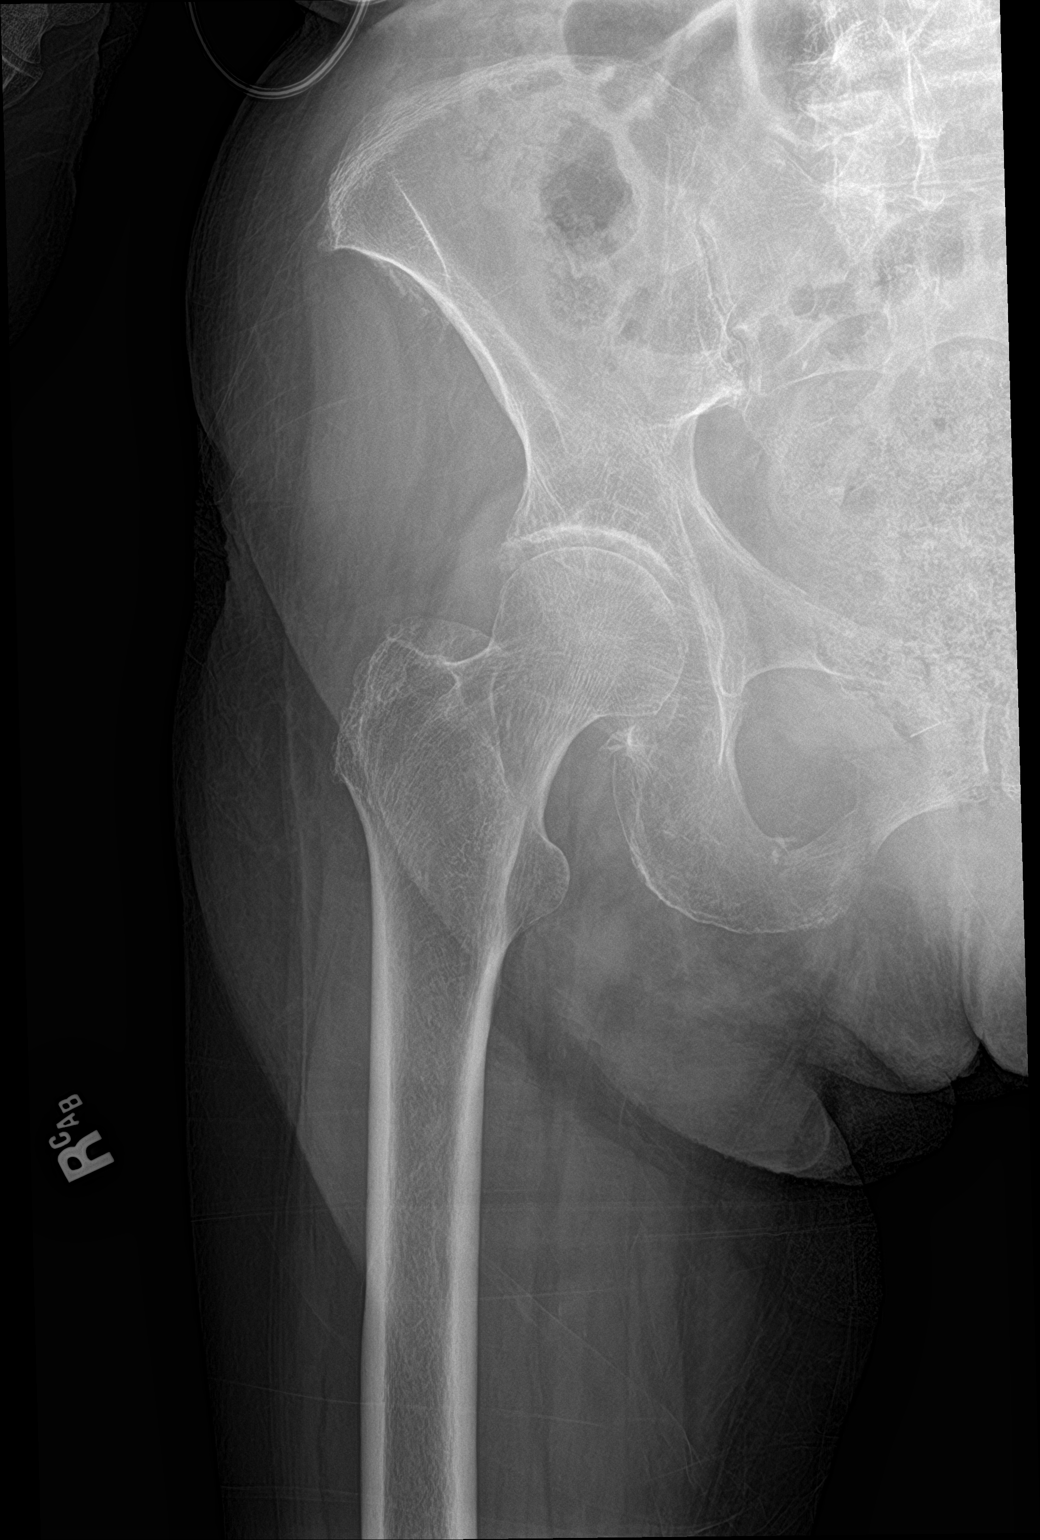

[hip lat]
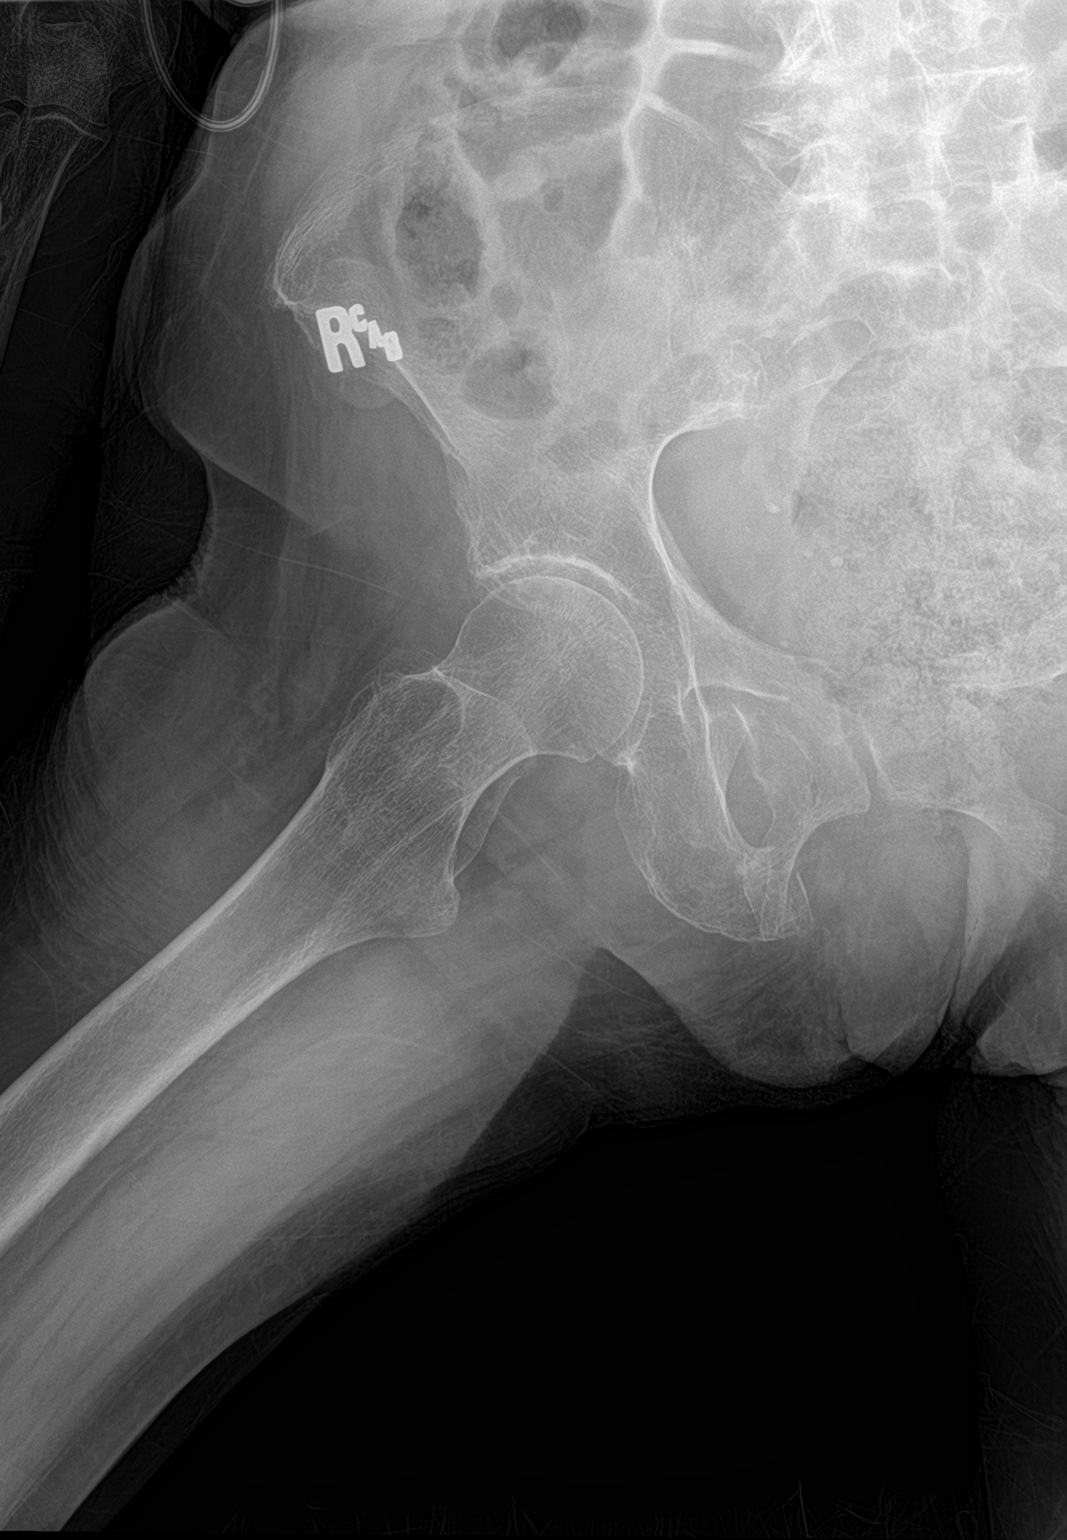

[3 of 3 positions shown; findings below may reference images not displayed]

FINDINGS: Acute minimally displaced fractures of the right superior and
inferior pubic rami. No femur fracture. The pubic symphysis and
sacroiliac joints are intact. Prior left total hip arthroplasty.
Severe osteopenia. Soft tissues are unremarkable.
IMPRESSION: Acute minimally displaced fractures of the right superior and
inferior pubic rami.
